# Patient Record
Sex: Male | Born: 1980 | Race: White | Hispanic: No | Marital: Married | Smoking: Former smoker
Health system: Southern US, Community
[De-identification: ages and names within clinical notes are randomized; demographics above are authoritative.]

## PROBLEM LIST (undated history)

## (undated) ENCOUNTER — Emergency Department (HOSPITAL_COMMUNITY): Payer: BC Managed Care – PPO

## (undated) DIAGNOSIS — F329 Major depressive disorder, single episode, unspecified: Secondary | ICD-10-CM

## (undated) DIAGNOSIS — F32A Depression, unspecified: Secondary | ICD-10-CM

## (undated) DIAGNOSIS — S52509A Unspecified fracture of the lower end of unspecified radius, initial encounter for closed fracture: Secondary | ICD-10-CM

## (undated) DIAGNOSIS — F419 Anxiety disorder, unspecified: Secondary | ICD-10-CM

## (undated) DIAGNOSIS — S82262A Displaced segmental fracture of shaft of left tibia, initial encounter for closed fracture: Secondary | ICD-10-CM

---

## 2012-04-05 ENCOUNTER — Emergency Department (HOSPITAL_BASED_OUTPATIENT_CLINIC_OR_DEPARTMENT_OTHER): Payer: BC Managed Care – PPO

## 2012-04-05 ENCOUNTER — Emergency Department (HOSPITAL_BASED_OUTPATIENT_CLINIC_OR_DEPARTMENT_OTHER)
Admission: EM | Admit: 2012-04-05 | Discharge: 2012-04-06 | Disposition: A | Discharge status: Home / Self Care | Payer: BC Managed Care – PPO | Attending: Emergency Medicine | Admitting: Emergency Medicine

## 2012-04-05 ENCOUNTER — Encounter (HOSPITAL_BASED_OUTPATIENT_CLINIC_OR_DEPARTMENT_OTHER): Payer: Self-pay | Admitting: *Deleted

## 2012-04-05 DIAGNOSIS — Z87891 Personal history of nicotine dependence: Secondary | ICD-10-CM | POA: Insufficient documentation

## 2012-04-05 DIAGNOSIS — J189 Pneumonia, unspecified organism: Secondary | ICD-10-CM | POA: Insufficient documentation

## 2012-04-05 DIAGNOSIS — IMO0001 Reserved for inherently not codable concepts without codable children: Secondary | ICD-10-CM | POA: Insufficient documentation

## 2012-04-05 DIAGNOSIS — R059 Cough, unspecified: Secondary | ICD-10-CM | POA: Insufficient documentation

## 2012-04-05 DIAGNOSIS — R05 Cough: Secondary | ICD-10-CM | POA: Insufficient documentation

## 2012-04-05 LAB — RAPID STREP SCREEN (MED CTR MEBANE ONLY): Streptococcus, Group A Screen (Direct): NEGATIVE

## 2012-04-05 MED ORDER — ALBUTEROL SULFATE HFA 108 (90 BASE) MCG/ACT IN AERS
2.0000 | INHALATION_SPRAY | RESPIRATORY_TRACT | Status: DC | PRN
  Administered 2012-04-06: 2 via RESPIRATORY_TRACT
  Filled 2012-04-05: qty 6.7

## 2012-04-05 MED ORDER — ACETAMINOPHEN 325 MG PO TABS
650.0000 mg | ORAL_TABLET | Freq: Once | ORAL | Status: AC
  Administered 2012-04-05: 650 mg via ORAL
  Filled 2012-04-05 (×2): qty 2

## 2012-04-05 NOTE — ED Provider Notes (Signed)
History   This chart was scribed for Peter Seamen, MD by Sofie Rower. The patient was seen in room MH11/MH11 and the patient's care was started at 11:18PM.       CSN: [id number]  Arrival date & time 04/05/12  2211   First MD Initiated Contact with Patient 04/05/12 2318      Chief Complaint  Patient presents with  . Fever    (Consider location/radiation/quality/duration/timing/severity/associated sxs/prior treatment) Patient is a 31 y.o. male presenting with fever. The history is provided by the patient. No language interpreter was used.  Fever Primary symptoms of the febrile illness include fever, cough and myalgias. Primary symptoms do not include abdominal pain, nausea, vomiting, diarrhea or rash. The current episode started yesterday. This is a new problem. The problem has been gradually worsening.  The fever began yesterday. The fever has been gradually worsening since its onset. The maximum temperature recorded prior to his arrival was 103 to 104 F. The temperature was taken by an oral thermometer.  The cough began yesterday. The cough is new. The cough is productive. The sputum is brown.  Myalgias began yesterday. The myalgias have been gradually worsening since their onset. The myalgias are generalized. The myalgias are aching. The discomfort from the myalgias is moderate. The myalgias are associated with weakness.    Peter Morrow is a 31 y.o. male who presents to the Emergency Department complaining of sudden, progressively worsening, fever (103.7, taken at home, 102.7, taken at [facility]), onset yesterday (04/04/12).  Associated symptoms include productive brown cough and myalgias. The pt reports he has been feeling weak and experiencing aching myalgias all over his body since yesterday evening (04/04/12). The pt has been taking ibuprofen and tylenol which provide moderate relief of the fever.   The pt denies sore throat, nausea, vomiting, diarrhea, chills, and abdominal pain  associated with the fever.    The pt does not smoke, however, he does drink alcohol.     History reviewed. No pertinent past medical history.  History reviewed. No pertinent past surgical history.  No family history on file.  History  Substance Use Topics  . Smoking status: Former Games developer  . Smokeless tobacco: Former Neurosurgeon  . Alcohol Use: 3.0 oz/week    5 Cans of beer per week      Review of Systems  Constitutional: Positive for fever.  Respiratory: Positive for cough.   Gastrointestinal: Negative for nausea, vomiting, abdominal pain and diarrhea.  Musculoskeletal: Positive for myalgias.  Skin: Negative for rash.  Neurological: Positive for weakness.  All other systems reviewed and are negative.    Allergies  Review of patient's allergies indicates no known allergies.  Home Medications   Current Outpatient Rx  Name Route Sig Dispense Refill  . ACETAMINOPHEN 500 MG PO TABS Oral Take 1,000 mg by mouth every 6 (six) hours as needed.    . IBUPROFEN 400 MG PO TABS Oral Take 400 mg by mouth every 6 (six) hours as needed.      BP 120/81  Pulse 135  Temp 102.7 F (39.3 C) (Oral)  Resp 22  Ht 5\' 11"  (1.803 m)  Wt 190 lb (86.183 kg)  BMI 26.50 kg/m2  SpO2 95%  Physical Exam  Nursing note and vitals reviewed. Constitutional: He is oriented to person, place, and time. He appears well-developed and well-nourished.  HENT:  Head: Atraumatic.  Nose: Nose normal.  Mouth/Throat: Oropharynx is clear and moist. No oropharyngeal exudate.       No oropharyngeal  erythema or exudate detected.   Eyes: Conjunctivae normal and EOM are normal. Pupils are equal, round, and reactive to light.  Neck: Normal range of motion.  Cardiovascular: Normal rate, regular rhythm and normal heart sounds.   Pulmonary/Chest: Effort normal and breath sounds normal.       Dry cough noted.   Abdominal: Soft. Bowel sounds are normal.  Musculoskeletal: Normal range of motion.  Neurological: He is  alert and oriented to person, place, and time.  Skin: Skin is warm and dry.  Psychiatric: He has a normal mood and affect. His behavior is normal.    ED Course  Procedures (including critical care time)  DIAGNOSTIC STUDIES: Oxygen Saturation is 95% on room air, normal by my interpretation.    COORDINATION OF CARE:    11:23 PM- Treatment plan concerning application of inhaler and evaluation for strep throat discussed with patient. Pt agrees with treatment.     MDM   Nursing notes and vitals signs, including pulse oximetry, reviewed.  Summary of this visit's results, reviewed by myself:  Labs:  Results for orders placed during the hospital encounter of 04/05/12  RAPID STREP SCREEN      Component Value Range   Streptococcus, Group A Screen (Direct) NEGATIVE  NEGATIVE    Imaging Studies: Dg Chest 2 View  04/06/2012  *RADIOLOGY REPORT*  Clinical Data: Cough and fever.  CHEST - 2 VIEW  Comparison: None.  Findings: There is focal airspace disease in the lingula consistent with pneumonia.  Right lung is clear.  Heart size is normal.  No pneumothorax or pleural fluid.  IMPRESSION: Findings consistent with pneumonia in the lingula.   Original Report Authenticated By: Bernadene Bell. Maricela Curet, M.D.           I personally performed the services described in this documentation, which was scribed in my presence.  The recorded information has been reviewed and considered.    Peter Seamen, MD 04/06/12 [phone]

## 2012-04-05 NOTE — ED Notes (Signed)
Patient states that he was working yesterday and began feeling very bad.  States that he had a fever of 103 deg.  C/O fever, chills, anorexia, headache, generalized body aches, weakness, fatigue and an occasional productive cough.  Patient states that he received his Flu shot 2 weeks ago.

## 2012-04-05 NOTE — ED Notes (Signed)
Fever, cough and body aches x 1 day- head pressure also

## 2012-04-06 MED ORDER — DOXYCYCLINE HYCLATE 100 MG PO CAPS: 100.0000 mg | ORAL_CAPSULE | Freq: Two times a day (BID) | ORAL | Status: DC

## 2012-04-06 MED ORDER — DOXYCYCLINE HYCLATE 100 MG PO TABS
100.0000 mg | ORAL_TABLET | Freq: Once | ORAL | Status: AC
  Administered 2012-04-06: 100 mg via ORAL
  Filled 2012-04-06: qty 1

## 2012-04-06 NOTE — Patient Instructions (Signed)
Instructed pt on the proper use of administering albuteral mdi via aerochamber pt tolerated well 

## 2012-04-06 NOTE — ED Notes (Signed)
rx x 1 given for doxycycline 

## 2015-06-11 DIAGNOSIS — S52509A Unspecified fracture of the lower end of unspecified radius, initial encounter for closed fracture: Secondary | ICD-10-CM

## 2015-06-11 HISTORY — DX: Unspecified fracture of the lower end of unspecified radius, initial encounter for closed fracture: S52.509A

## 2016-09-17 ENCOUNTER — Observation Stay (HOSPITAL_COMMUNITY): Payer: Worker's Compensation | Admitting: Certified Registered Nurse Anesthetist

## 2016-09-17 ENCOUNTER — Inpatient Hospital Stay (HOSPITAL_COMMUNITY)
Admission: EM | Admit: 2016-09-17 | Discharge: 2016-09-24 | DRG: 493 | Disposition: A | Discharge status: Rehabilitation Facility | Payer: Worker's Compensation | Attending: Orthopedic Surgery | Admitting: Orthopedic Surgery

## 2016-09-17 ENCOUNTER — Encounter (HOSPITAL_COMMUNITY): Payer: Self-pay | Admitting: Certified Registered Nurse Anesthetist

## 2016-09-17 ENCOUNTER — Emergency Department (HOSPITAL_COMMUNITY): Payer: Worker's Compensation

## 2016-09-17 ENCOUNTER — Observation Stay (HOSPITAL_COMMUNITY): Payer: Worker's Compensation

## 2016-09-17 ENCOUNTER — Encounter (HOSPITAL_COMMUNITY): Admission: EM | Disposition: A | Discharge status: Rehabilitation Facility | Payer: Self-pay | Source: Home / Self Care

## 2016-09-17 DIAGNOSIS — Z79899 Other long term (current) drug therapy: Secondary | ICD-10-CM

## 2016-09-17 DIAGNOSIS — S82262A Displaced segmental fracture of shaft of left tibia, initial encounter for closed fracture: Secondary | ICD-10-CM | POA: Diagnosis not present

## 2016-09-17 DIAGNOSIS — R110 Nausea: Secondary | ICD-10-CM | POA: Diagnosis present

## 2016-09-17 DIAGNOSIS — R739 Hyperglycemia, unspecified: Secondary | ICD-10-CM | POA: Diagnosis not present

## 2016-09-17 DIAGNOSIS — Y9389 Activity, other specified: Secondary | ICD-10-CM

## 2016-09-17 DIAGNOSIS — S32810A Multiple fractures of pelvis with stable disruption of pelvic ring, initial encounter for closed fracture: Secondary | ICD-10-CM

## 2016-09-17 DIAGNOSIS — S82202A Unspecified fracture of shaft of left tibia, initial encounter for closed fracture: Secondary | ICD-10-CM

## 2016-09-17 DIAGNOSIS — F329 Major depressive disorder, single episode, unspecified: Secondary | ICD-10-CM

## 2016-09-17 DIAGNOSIS — Y990 Civilian activity done for income or pay: Secondary | ICD-10-CM

## 2016-09-17 DIAGNOSIS — S7002XA Contusion of left hip, initial encounter: Secondary | ICD-10-CM | POA: Diagnosis present

## 2016-09-17 DIAGNOSIS — S32009A Unspecified fracture of unspecified lumbar vertebra, initial encounter for closed fracture: Secondary | ICD-10-CM

## 2016-09-17 DIAGNOSIS — Z419 Encounter for procedure for purposes other than remedying health state, unspecified: Secondary | ICD-10-CM

## 2016-09-17 DIAGNOSIS — T148XXA Other injury of unspecified body region, initial encounter: Secondary | ICD-10-CM

## 2016-09-17 DIAGNOSIS — D696 Thrombocytopenia, unspecified: Secondary | ICD-10-CM | POA: Diagnosis present

## 2016-09-17 DIAGNOSIS — E559 Vitamin D deficiency, unspecified: Secondary | ICD-10-CM | POA: Diagnosis present

## 2016-09-17 DIAGNOSIS — S2020XA Contusion of thorax, unspecified, initial encounter: Secondary | ICD-10-CM

## 2016-09-17 DIAGNOSIS — T1490XA Injury, unspecified, initial encounter: Secondary | ICD-10-CM

## 2016-09-17 DIAGNOSIS — S82402A Unspecified fracture of shaft of left fibula, initial encounter for closed fracture: Secondary | ICD-10-CM | POA: Diagnosis present

## 2016-09-17 DIAGNOSIS — E8889 Other specified metabolic disorders: Secondary | ICD-10-CM | POA: Diagnosis present

## 2016-09-17 DIAGNOSIS — D62 Acute posthemorrhagic anemia: Secondary | ICD-10-CM

## 2016-09-17 DIAGNOSIS — S3210XA Unspecified fracture of sacrum, initial encounter for closed fracture: Secondary | ICD-10-CM | POA: Diagnosis present

## 2016-09-17 DIAGNOSIS — Z8042 Family history of malignant neoplasm of prostate: Secondary | ICD-10-CM

## 2016-09-17 DIAGNOSIS — S32059A Unspecified fracture of fifth lumbar vertebra, initial encounter for closed fracture: Secondary | ICD-10-CM | POA: Diagnosis present

## 2016-09-17 DIAGNOSIS — R0682 Tachypnea, not elsewhere classified: Secondary | ICD-10-CM

## 2016-09-17 DIAGNOSIS — F32A Depression, unspecified: Secondary | ICD-10-CM

## 2016-09-17 DIAGNOSIS — S301XXA Contusion of abdominal wall, initial encounter: Secondary | ICD-10-CM | POA: Diagnosis present

## 2016-09-17 DIAGNOSIS — D72829 Elevated white blood cell count, unspecified: Secondary | ICD-10-CM

## 2016-09-17 DIAGNOSIS — S300XXA Contusion of lower back and pelvis, initial encounter: Secondary | ICD-10-CM

## 2016-09-17 DIAGNOSIS — Y929 Unspecified place or not applicable: Secondary | ICD-10-CM

## 2016-09-17 DIAGNOSIS — S82102A Unspecified fracture of upper end of left tibia, initial encounter for closed fracture: Secondary | ICD-10-CM | POA: Diagnosis present

## 2016-09-17 DIAGNOSIS — F410 Panic disorder [episodic paroxysmal anxiety] without agoraphobia: Secondary | ICD-10-CM | POA: Diagnosis present

## 2016-09-17 DIAGNOSIS — G4700 Insomnia, unspecified: Secondary | ICD-10-CM | POA: Diagnosis not present

## 2016-09-17 DIAGNOSIS — S7012XA Contusion of left thigh, initial encounter: Secondary | ICD-10-CM | POA: Diagnosis present

## 2016-09-17 DIAGNOSIS — W208XXA Other cause of strike by thrown, projected or falling object, initial encounter: Secondary | ICD-10-CM | POA: Diagnosis present

## 2016-09-17 DIAGNOSIS — Z87891 Personal history of nicotine dependence: Secondary | ICD-10-CM

## 2016-09-17 DIAGNOSIS — R000 Tachycardia, unspecified: Secondary | ICD-10-CM

## 2016-09-17 DIAGNOSIS — R21 Rash and other nonspecific skin eruption: Secondary | ICD-10-CM | POA: Diagnosis not present

## 2016-09-17 DIAGNOSIS — K5900 Constipation, unspecified: Secondary | ICD-10-CM | POA: Diagnosis present

## 2016-09-17 DIAGNOSIS — N179 Acute kidney failure, unspecified: Secondary | ICD-10-CM | POA: Diagnosis present

## 2016-09-17 DIAGNOSIS — S3993XA Unspecified injury of pelvis, initial encounter: Secondary | ICD-10-CM | POA: Diagnosis present

## 2016-09-17 DIAGNOSIS — G8918 Other acute postprocedural pain: Secondary | ICD-10-CM

## 2016-09-17 HISTORY — PX: SACRO-ILIAC PINNING: SHX5050

## 2016-09-17 HISTORY — DX: Anxiety disorder, unspecified: F41.9

## 2016-09-17 HISTORY — DX: Unspecified fracture of the lower end of unspecified radius, initial encounter for closed fracture: S52.509A

## 2016-09-17 HISTORY — DX: Displaced segmental fracture of shaft of left tibia, initial encounter for closed fracture: S82.262A

## 2016-09-17 HISTORY — DX: Depression, unspecified: F32.A

## 2016-09-17 HISTORY — DX: Major depressive disorder, single episode, unspecified: F32.9

## 2016-09-17 HISTORY — PX: TIBIA IM NAIL INSERTION: SHX2516

## 2016-09-17 LAB — PREPARE FRESH FROZEN PLASMA
UNIT DIVISION: 0
Unit division: 0

## 2016-09-17 LAB — I-STAT CHEM 8, ED
BUN: 18 mg/dL (ref 6–20)
CREATININE: 1.3 mg/dL — AB (ref 0.61–1.24)
Calcium, Ion: 1.08 mmol/L — ABNORMAL LOW (ref 1.15–1.40)
Chloride: 103 mmol/L (ref 101–111)
Glucose, Bld: 161 mg/dL — ABNORMAL HIGH (ref 65–99)
HEMATOCRIT: 44 % (ref 39.0–52.0)
Hemoglobin: 15 g/dL (ref 13.0–17.0)
Potassium: 3.5 mmol/L (ref 3.5–5.1)
Sodium: 139 mmol/L (ref 135–145)
TCO2: 25 mmol/L (ref 0–100)

## 2016-09-17 LAB — CBC WITH DIFFERENTIAL/PLATELET
BASOS ABS: 0 10*3/uL (ref 0.0–0.1)
Basophils Relative: 0 %
EOS PCT: 0 %
Eosinophils Absolute: 0 10*3/uL (ref 0.0–0.7)
HEMATOCRIT: 44 % (ref 39.0–52.0)
Hemoglobin: 14.7 g/dL (ref 13.0–17.0)
LYMPHS ABS: 2.5 10*3/uL (ref 0.7–4.0)
Lymphocytes Relative: 10 %
MCH: 28.3 pg (ref 26.0–34.0)
MCHC: 33.4 g/dL (ref 30.0–36.0)
MCV: 84.6 fL (ref 78.0–100.0)
MONOS PCT: 6 %
Monocytes Absolute: 1.5 10*3/uL — ABNORMAL HIGH (ref 0.1–1.0)
Neutro Abs: 21 10*3/uL — ABNORMAL HIGH (ref 1.7–7.7)
Neutrophils Relative %: 84 %
Platelets: 232 10*3/uL (ref 150–400)
RBC: 5.2 MIL/uL (ref 4.22–5.81)
RDW: 13.3 % (ref 11.5–15.5)
WBC: 25 10*3/uL — AB (ref 4.0–10.5)

## 2016-09-17 LAB — BPAM FFP
Blood Product Expiration Date: 201804112359
Blood Product Expiration Date: 201804112359
ISSUE DATE / TIME: 201804101756
ISSUE DATE / TIME: 201804101756
UNIT TYPE AND RH: 6200
Unit Type and Rh: 6200

## 2016-09-17 LAB — ABO/RH: ABO/RH(D): A POS

## 2016-09-17 LAB — HEMOGLOBIN AND HEMATOCRIT, BLOOD
HCT: 40 % (ref 39.0–52.0)
Hemoglobin: 13.2 g/dL (ref 13.0–17.0)

## 2016-09-17 LAB — BASIC METABOLIC PANEL
Anion gap: 11 (ref 5–15)
BUN: 15 mg/dL (ref 6–20)
CHLORIDE: 104 mmol/L (ref 101–111)
CO2: 23 mmol/L (ref 22–32)
Calcium: 8.8 mg/dL — ABNORMAL LOW (ref 8.9–10.3)
Creatinine, Ser: 1.29 mg/dL — ABNORMAL HIGH (ref 0.61–1.24)
GFR calc non Af Amer: >60 mL/min (ref >60)
Glucose, Bld: 162 mg/dL — ABNORMAL HIGH (ref 65–99)
Potassium: 3.6 mmol/L (ref 3.5–5.1)
SODIUM: 138 mmol/L (ref 135–145)

## 2016-09-17 LAB — CBC
HCT: 36.4 % — ABNORMAL LOW (ref 39.0–52.0)
HEMOGLOBIN: 11.9 g/dL — AB (ref 13.0–17.0)
MCH: 28 pg (ref 26.0–34.0)
MCHC: 32.7 g/dL (ref 30.0–36.0)
MCV: 85.6 fL (ref 78.0–100.0)
Platelets: 157 10*3/uL (ref 150–400)
RBC: 4.25 MIL/uL (ref 4.22–5.81)
RDW: 13.6 % (ref 11.5–15.5)
WBC: 15.6 10*3/uL — ABNORMAL HIGH (ref 4.0–10.5)

## 2016-09-17 LAB — SAMPLE TO BLOOD BANK

## 2016-09-17 LAB — PREPARE RBC (CROSSMATCH)

## 2016-09-17 SURGERY — INSERTION, INTRAMEDULLARY ROD, TIBIA
Anesthesia: General | Site: Leg Lower | Laterality: Left

## 2016-09-17 MED ORDER — CEFAZOLIN IN D5W 1 GM/50ML IV SOLN
1.0000 g | Freq: Four times a day (QID) | INTRAVENOUS | Status: AC
  Administered 2016-09-17 – 2016-09-18 (×3): 1 g via INTRAVENOUS
  Filled 2016-09-17 (×4): qty 50

## 2016-09-17 MED ORDER — DEXAMETHASONE SODIUM PHOSPHATE 10 MG/ML IJ SOLN
INTRAMUSCULAR | Status: DC | PRN
  Administered 2016-09-17: 4 mg via INTRAVENOUS

## 2016-09-17 MED ORDER — LIDOCAINE HCL (CARDIAC) 20 MG/ML IV SOLN
INTRAVENOUS | Status: DC | PRN
  Administered 2016-09-17: 60 mg via INTRAVENOUS

## 2016-09-17 MED ORDER — OXYCODONE HCL 5 MG PO TABS: 5.0000 mg | ORAL_TABLET | Freq: Once | ORAL | Status: DC | PRN

## 2016-09-17 MED ORDER — ROCURONIUM BROMIDE 100 MG/10ML IV SOLN
INTRAVENOUS | Status: DC | PRN
  Administered 2016-09-17: 20 mg via INTRAVENOUS
  Administered 2016-09-17: 50 mg via INTRAVENOUS
  Administered 2016-09-17: 30 mg via INTRAVENOUS

## 2016-09-17 MED ORDER — SODIUM CHLORIDE 0.9 % IV SOLN
INTRAVENOUS | Status: DC | PRN
  Administered 2016-09-17: 19:00:00 via INTRAVENOUS

## 2016-09-17 MED ORDER — HYDROMORPHONE HCL 1 MG/ML IJ SOLN
INTRAMUSCULAR | Status: AC
  Filled 2016-09-17: qty 1

## 2016-09-17 MED ORDER — OXYCODONE HCL 5 MG/5ML PO SOLN: 5.0000 mg | Freq: Once | ORAL | Status: DC | PRN

## 2016-09-17 MED ORDER — HYDROMORPHONE HCL 1 MG/ML IJ SOLN
INTRAMUSCULAR | Status: AC
  Administered 2016-09-17: 0.5 mg via INTRAVENOUS
  Filled 2016-09-17: qty 1

## 2016-09-17 MED ORDER — FENTANYL CITRATE (PF) 250 MCG/5ML IJ SOLN
INTRAMUSCULAR | Status: AC
  Filled 2016-09-17: qty 5

## 2016-09-17 MED ORDER — HYDROMORPHONE HCL 1 MG/ML IJ SOLN
0.5000 mg | INTRAMUSCULAR | Status: DC | PRN
  Administered 2016-09-17 – 2016-09-18 (×2): 1 mg via INTRAVENOUS
  Filled 2016-09-17 (×2): qty 1

## 2016-09-17 MED ORDER — METHOCARBAMOL 500 MG PO TABS
500.0000 mg | ORAL_TABLET | Freq: Four times a day (QID) | ORAL | Status: DC | PRN
  Administered 2016-09-18 – 2016-09-23 (×7): 1000 mg via ORAL
  Filled 2016-09-17 (×8): qty 2

## 2016-09-17 MED ORDER — LACTATED RINGERS IV SOLN
INTRAVENOUS | Status: DC | PRN
  Administered 2016-09-17 (×3): via INTRAVENOUS

## 2016-09-17 MED ORDER — IOPAMIDOL (ISOVUE-300) INJECTION 61%
INTRAVENOUS | Status: AC
  Filled 2016-09-17: qty 100

## 2016-09-17 MED ORDER — SUGAMMADEX SODIUM 200 MG/2ML IV SOLN
INTRAVENOUS | Status: DC | PRN
  Administered 2016-09-17: 200 mg via INTRAVENOUS

## 2016-09-17 MED ORDER — SODIUM CHLORIDE 0.9 % IV BOLUS (SEPSIS)
1000.0000 mL | Freq: Once | INTRAVENOUS | Status: AC
  Administered 2016-09-17: 1000 mL via INTRAVENOUS

## 2016-09-17 MED ORDER — SODIUM CHLORIDE 0.9 % IV SOLN: 10.0000 mL/h | Freq: Once | INTRAVENOUS | Status: DC

## 2016-09-17 MED ORDER — METOCLOPRAMIDE HCL 5 MG PO TABS: 5.0000 mg | ORAL_TABLET | Freq: Three times a day (TID) | ORAL | Status: DC | PRN

## 2016-09-17 MED ORDER — ONDANSETRON HCL 4 MG/2ML IJ SOLN
4.0000 mg | Freq: Four times a day (QID) | INTRAMUSCULAR | Status: DC | PRN
  Administered 2016-09-19 – 2016-09-23 (×3): 4 mg via INTRAVENOUS
  Filled 2016-09-17 (×3): qty 2

## 2016-09-17 MED ORDER — METHOCARBAMOL 1000 MG/10ML IJ SOLN
500.0000 mg | Freq: Four times a day (QID) | INTRAVENOUS | Status: DC | PRN
  Filled 2016-09-17: qty 5

## 2016-09-17 MED ORDER — ONDANSETRON HCL 4 MG/2ML IJ SOLN
4.0000 mg | Freq: Once | INTRAMUSCULAR | Status: AC
  Administered 2016-09-17: 4 mg via INTRAVENOUS
  Filled 2016-09-17: qty 2

## 2016-09-17 MED ORDER — SUCCINYLCHOLINE CHLORIDE 20 MG/ML IJ SOLN
INTRAMUSCULAR | Status: DC | PRN
  Administered 2016-09-17: 70 mg via INTRAVENOUS

## 2016-09-17 MED ORDER — PHENYLEPHRINE 40 MCG/ML (10ML) SYRINGE FOR IV PUSH (FOR BLOOD PRESSURE SUPPORT)
PREFILLED_SYRINGE | INTRAVENOUS | Status: AC
  Filled 2016-09-17: qty 10

## 2016-09-17 MED ORDER — SODIUM CHLORIDE 0.9 % IV SOLN
10.0000 mL/h | Freq: Once | INTRAVENOUS | Status: AC
  Administered 2016-09-17: 10 mL/h via INTRAVENOUS

## 2016-09-17 MED ORDER — HYDROCODONE-ACETAMINOPHEN 10-325 MG PO TABS
1.0000 | ORAL_TABLET | Freq: Four times a day (QID) | ORAL | Status: DC | PRN
  Administered 2016-09-18 – 2016-09-19 (×2): 2 via ORAL
  Administered 2016-09-19: 1 via ORAL
  Administered 2016-09-19: 2 via ORAL
  Filled 2016-09-17 (×3): qty 2
  Filled 2016-09-17: qty 1
  Filled 2016-09-17: qty 2

## 2016-09-17 MED ORDER — DEXAMETHASONE SODIUM PHOSPHATE 10 MG/ML IJ SOLN
INTRAMUSCULAR | Status: AC
  Filled 2016-09-17: qty 1

## 2016-09-17 MED ORDER — MIDAZOLAM HCL 2 MG/2ML IJ SOLN
INTRAMUSCULAR | Status: AC
  Filled 2016-09-17: qty 2

## 2016-09-17 MED ORDER — ACETAMINOPHEN 650 MG RE SUPP: 650.0000 mg | Freq: Four times a day (QID) | RECTAL | Status: DC | PRN

## 2016-09-17 MED ORDER — PHENYLEPHRINE HCL 10 MG/ML IJ SOLN
INTRAMUSCULAR | Status: DC | PRN
  Administered 2016-09-17: 100 ug/min via INTRAVENOUS

## 2016-09-17 MED ORDER — ONDANSETRON HCL 4 MG/2ML IJ SOLN
INTRAMUSCULAR | Status: AC
  Filled 2016-09-17: qty 2

## 2016-09-17 MED ORDER — ACETAMINOPHEN 325 MG PO TABS: 650.0000 mg | ORAL_TABLET | ORAL | Status: DC | PRN

## 2016-09-17 MED ORDER — ONDANSETRON HCL 4 MG PO TABS
4.0000 mg | ORAL_TABLET | Freq: Four times a day (QID) | ORAL | Status: DC | PRN
  Filled 2016-09-17: qty 1

## 2016-09-17 MED ORDER — CEFAZOLIN SODIUM-DEXTROSE 2-3 GM-% IV SOLR
INTRAVENOUS | Status: DC | PRN
  Administered 2016-09-17: 2 g via INTRAVENOUS

## 2016-09-17 MED ORDER — HYDROMORPHONE HCL 1 MG/ML IJ SOLN
1.0000 mg | Freq: Once | INTRAMUSCULAR | Status: AC
  Administered 2016-09-17: 1 mg via INTRAVENOUS
  Filled 2016-09-17: qty 1

## 2016-09-17 MED ORDER — ACETAMINOPHEN 325 MG PO TABS: 650.0000 mg | ORAL_TABLET | Freq: Four times a day (QID) | ORAL | Status: DC | PRN

## 2016-09-17 MED ORDER — HYDROMORPHONE HCL 1 MG/ML IJ SOLN
0.2500 mg | INTRAMUSCULAR | Status: DC | PRN
  Administered 2016-09-17 (×2): 0.5 mg via INTRAVENOUS

## 2016-09-17 MED ORDER — DOCUSATE SODIUM 100 MG PO CAPS
100.0000 mg | ORAL_CAPSULE | Freq: Two times a day (BID) | ORAL | Status: DC
  Administered 2016-09-18 – 2016-09-24 (×12): 100 mg via ORAL
  Filled 2016-09-17 (×13): qty 1

## 2016-09-17 MED ORDER — LORAZEPAM 2 MG/ML IJ SOLN
0.5000 mg | Freq: Three times a day (TID) | INTRAMUSCULAR | Status: DC | PRN
  Administered 2016-09-20 – 2016-09-23 (×4): 0.5 mg via INTRAVENOUS
  Filled 2016-09-17 (×4): qty 1

## 2016-09-17 MED ORDER — LACTATED RINGERS IV SOLN
INTRAVENOUS | Status: DC | PRN
  Administered 2016-09-17 (×2): via INTRAVENOUS

## 2016-09-17 MED ORDER — PROPOFOL 10 MG/ML IV BOLUS
INTRAVENOUS | Status: AC
  Filled 2016-09-17: qty 20

## 2016-09-17 MED ORDER — FENTANYL CITRATE (PF) 100 MCG/2ML IJ SOLN
INTRAMUSCULAR | Status: DC | PRN
  Administered 2016-09-17: 50 ug via INTRAVENOUS
  Administered 2016-09-17: 150 ug via INTRAVENOUS
  Administered 2016-09-17: 50 ug via INTRAVENOUS

## 2016-09-17 MED ORDER — 0.9 % SODIUM CHLORIDE (POUR BTL) OPTIME
TOPICAL | Status: DC | PRN
  Administered 2016-09-17: 1000 mL

## 2016-09-17 MED ORDER — PROPOFOL 10 MG/ML IV BOLUS
INTRAVENOUS | Status: DC | PRN
Start: 2016-09-17 — End: 2016-09-17
  Administered 2016-09-17: 50 mg via INTRAVENOUS
  Administered 2016-09-17: 30 mg via INTRAVENOUS
  Administered 2016-09-17: 100 mg via INTRAVENOUS

## 2016-09-17 MED ORDER — METOCLOPRAMIDE HCL 5 MG/ML IJ SOLN: 5.0000 mg | Freq: Three times a day (TID) | INTRAMUSCULAR | Status: DC | PRN

## 2016-09-17 MED ORDER — SODIUM CHLORIDE 0.45 % IV SOLN
INTRAVENOUS | Status: DC
  Administered 2016-09-17 – 2016-09-19 (×4): via INTRAVENOUS

## 2016-09-17 MED ORDER — MIDAZOLAM HCL 5 MG/5ML IJ SOLN
INTRAMUSCULAR | Status: DC | PRN
  Administered 2016-09-17: 2 mg via INTRAVENOUS

## 2016-09-17 MED ORDER — ONDANSETRON HCL 4 MG/2ML IJ SOLN
INTRAMUSCULAR | Status: DC | PRN
  Administered 2016-09-17: 4 mg via INTRAVENOUS

## 2016-09-17 SURGICAL SUPPLY — 76 items
BANDAGE ACE 4X5 VEL STRL LF (GAUZE/BANDAGES/DRESSINGS) ×4 IMPLANT
BANDAGE ACE 6X5 VEL STRL LF (GAUZE/BANDAGES/DRESSINGS) ×4 IMPLANT
BANDAGE ESMARK 6X9 LF (GAUZE/BANDAGES/DRESSINGS) IMPLANT
BIT DRILL 3.8X6 NS (BIT) ×4 IMPLANT
BIT DRILL 4.4 NS (BIT) ×4 IMPLANT
BLADE CLIPPER SURG (BLADE) ×4 IMPLANT
BLADE SURG 10 STRL SS (BLADE) IMPLANT
BNDG COHESIVE 4X5 TAN STRL (GAUZE/BANDAGES/DRESSINGS) ×4 IMPLANT
BNDG ESMARK 6X9 LF (GAUZE/BANDAGES/DRESSINGS)
BNDG GAUZE ELAST 4 BULKY (GAUZE/BANDAGES/DRESSINGS) ×4 IMPLANT
BRUSH SCRUB DISP (MISCELLANEOUS) ×8 IMPLANT
CATH THORACIC 20FR (CATHETERS) ×4 IMPLANT
CONNECTOR 5 IN 1 STRAIGHT STRL (MISCELLANEOUS) ×4 IMPLANT
COVER SURGICAL LIGHT HANDLE (MISCELLANEOUS) ×8 IMPLANT
DRAPE C-ARM 42X72 X-RAY (DRAPES) ×4 IMPLANT
DRAPE C-ARMOR (DRAPES) ×8 IMPLANT
DRAPE IMP U-DRAPE 54X76 (DRAPES) ×4 IMPLANT
DRAPE INCISE IOBAN 66X45 STRL (DRAPES) ×4 IMPLANT
DRAPE LAPAROTOMY TRNSV 102X78 (DRAPE) ×4 IMPLANT
DRAPE ORTHO SPLIT 77X108 STRL (DRAPES) ×4
DRAPE SURG ORHT 6 SPLT 77X108 (DRAPES) ×4 IMPLANT
DRAPE U-SHAPE 47X51 STRL (DRAPES) ×8 IMPLANT
DRSG ADAPTIC 3X8 NADH LF (GAUZE/BANDAGES/DRESSINGS) IMPLANT
DRSG MEPILEX BORDER 4X4 (GAUZE/BANDAGES/DRESSINGS) ×4 IMPLANT
DRSG MEPITEL 4X7.2 (GAUZE/BANDAGES/DRESSINGS) ×4 IMPLANT
DRSG PAD ABDOMINAL 8X10 ST (GAUZE/BANDAGES/DRESSINGS) ×12 IMPLANT
ELECT CAUTERY BLADE 6.4 (BLADE) ×4 IMPLANT
ELECT REM PT RETURN 9FT ADLT (ELECTROSURGICAL) ×4
ELECTRODE REM PT RTRN 9FT ADLT (ELECTROSURGICAL) ×2 IMPLANT
EVACUATOR 1/8 PVC DRAIN (DRAIN) IMPLANT
GAUZE SPONGE 4X4 12PLY STRL (GAUZE/BANDAGES/DRESSINGS) ×16 IMPLANT
GLOVE BIO SURGEON STRL SZ 6.5 (GLOVE) ×12 IMPLANT
GLOVE BIO SURGEON STRL SZ7 (GLOVE) ×4 IMPLANT
GLOVE BIO SURGEON STRL SZ8 (GLOVE) ×16 IMPLANT
GLOVE BIO SURGEONS STRL SZ 6.5 (GLOVE) ×4
GLOVE BIOGEL PI IND STRL 7.5 (GLOVE) ×4 IMPLANT
GLOVE BIOGEL PI IND STRL 8 (GLOVE) ×2 IMPLANT
GLOVE BIOGEL PI INDICATOR 7.5 (GLOVE) ×4
GLOVE BIOGEL PI INDICATOR 8 (GLOVE) ×2
GLOVE SURG SS PI 7.5 STRL IVOR (GLOVE) ×4 IMPLANT
GLOVE XGUARD RR 2 7.5 (GLOVE) ×2 IMPLANT
GLOVE XGUARD RR2 7.5 (GLOVE) ×2
GOWN STRL REUS W/ TWL LRG LVL3 (GOWN DISPOSABLE) ×8 IMPLANT
GOWN STRL REUS W/TWL LRG LVL3 (GOWN DISPOSABLE) ×8
GUIDEWIRE BALL NOSE 80CM (WIRE) ×4 IMPLANT
KIT BASIN OR (CUSTOM PROCEDURE TRAY) ×4 IMPLANT
KIT ROOM TURNOVER OR (KITS) ×4 IMPLANT
NAIL TIBIAL 9MMX34.5CM (Nail) ×4 IMPLANT
PACK GENERAL/GYN (CUSTOM PROCEDURE TRAY) ×4 IMPLANT
PACK UNIVERSAL I (CUSTOM PROCEDURE TRAY) ×4 IMPLANT
PAD ARMBOARD 7.5X6 YLW CONV (MISCELLANEOUS) ×8 IMPLANT
PAD CAST 4YDX4 CTTN HI CHSV (CAST SUPPLIES) ×2 IMPLANT
PADDING CAST COTTON 4X4 STRL (CAST SUPPLIES) ×2
SCREW ACECAP 40MM (Screw) ×4 IMPLANT
SCREW ACECAP 46MM (Screw) ×4 IMPLANT
SCREW CANN 7.3X90 32MM THRD (Screw) ×4 IMPLANT
SCREW PROXIMAL 4.5MMX18MM (Screw) ×4 IMPLANT
SCREW PROXIMAL DEPUY (Screw) ×4 IMPLANT
SCREW PRXML FT 65X5.5XNS CORT (Screw) ×4 IMPLANT
STAPLER VISISTAT 35W (STAPLE) ×4 IMPLANT
SUT ETHILON 2 0 PSLX (SUTURE) ×4 IMPLANT
SUT ETHILON 3 0 PS 1 (SUTURE) ×8 IMPLANT
SUT PROLENE 3 0 PS 2 (SUTURE) ×4 IMPLANT
SUT VIC AB 0 CT1 27 (SUTURE) ×2
SUT VIC AB 0 CT1 27XBRD ANBCTR (SUTURE) ×2 IMPLANT
SUT VIC AB 2-0 CT1 27 (SUTURE) ×4
SUT VIC AB 2-0 CT1 TAPERPNT 27 (SUTURE) ×4 IMPLANT
SUT VIC AB 2-0 CT3 27 (SUTURE) IMPLANT
SYSTEM SAHARA CHEST DRAIN ATS (WOUND CARE) ×4 IMPLANT
TOWEL OR 17X24 6PK STRL BLUE (TOWEL DISPOSABLE) ×4 IMPLANT
TOWEL OR 17X26 10 PK STRL BLUE (TOWEL DISPOSABLE) ×4 IMPLANT
TRAY FOLEY W/METER SILVER 16FR (SET/KITS/TRAYS/PACK) ×4 IMPLANT
TUBE CONNECTING 12'X1/4 (SUCTIONS) ×1
TUBE CONNECTING 12X1/4 (SUCTIONS) ×3 IMPLANT
WASHER OIC 13MM 6 PACK (Screw) ×4 IMPLANT
YANKAUER SUCT BULB TIP NO VENT (SUCTIONS) ×8 IMPLANT

## 2016-09-17 NOTE — Consult Note (Signed)
Reason for Consult:Pelvic ring fx, left tib/fib fx Referring Physician: Raliegh Ip Jawanza Morrow is an 36 y.o. male.  HPI: Peter Morrow was supervising a tree being cut down when it fell in an unexpected way, striking him on the left side. It knocked him down and then landed on his left lower leg. He was brought to the ED for evaluation. Workup showed a pelvic ring fx and a left tib/fib fx. He also had tachycardia and lumbar TVP fxs.  No past medical history on file.  No past surgical history on file.  No family history on file.  Social History:  reports that he has quit smoking. He has quit using smokeless tobacco. He reports that he drinks about 3.0 oz of alcohol per week . He reports that he does not use drugs.  Allergies: No Known Allergies  Medications: I have reviewed the patient's current medications.  Results for orders placed or performed during the hospital encounter of 09/17/16 (from the past 48 hour(s))  Sample to Blood Bank     Status: None   Collection Time: 09/17/16 12:15 PM  Result Value Ref Range   Blood Bank Specimen SAMPLE AVAILABLE FOR TESTING    Sample Expiration 09/18/2016   CBC with Differential     Status: Abnormal   Collection Time: 09/17/16 12:15 PM  Result Value Ref Range   WBC 25.0 (H) 4.0 - 10.5 K/uL   RBC 5.20 4.22 - 5.81 MIL/uL   Hemoglobin 14.7 13.0 - 17.0 g/dL   HCT 44.0 39.0 - 52.0 %   MCV 84.6 78.0 - 100.0 fL   MCH 28.3 26.0 - 34.0 pg   MCHC 33.4 30.0 - 36.0 g/dL   RDW 13.3 11.5 - 15.5 %   Platelets 232 150 - 400 K/uL   Neutrophils Relative % 84 %   Lymphocytes Relative 10 %   Monocytes Relative 6 %   Eosinophils Relative 0 %   Basophils Relative 0 %   Neutro Abs 21.0 (H) 1.7 - 7.7 K/uL   Lymphs Abs 2.5 0.7 - 4.0 K/uL   Monocytes Absolute 1.5 (H) 0.1 - 1.0 K/uL   Eosinophils Absolute 0.0 0.0 - 0.7 K/uL   Basophils Absolute 0.0 0.0 - 0.1 K/uL   WBC Morphology MILD LEFT SHIFT (1-5% METAS, OCC MYELO, OCC BANDS)    Smear Review LARGE  PLATELETS PRESENT   Basic metabolic panel     Status: Abnormal   Collection Time: 09/17/16 12:15 PM  Result Value Ref Range   Sodium 138 135 - 145 mmol/L   Potassium 3.6 3.5 - 5.1 mmol/L   Chloride 104 101 - 111 mmol/L   CO2 23 22 - 32 mmol/L   Glucose, Bld 162 (H) 65 - 99 mg/dL   BUN 15 6 - 20 mg/dL   Creatinine, Ser 1.29 (H) 0.61 - 1.24 mg/dL   Calcium 8.8 (L) 8.9 - 10.3 mg/dL   GFR calc non Af Amer >60 >60 mL/min   GFR calc Af Amer >60 >60 mL/min    Comment: (NOTE) The eGFR has been calculated using the CKD EPI equation. This calculation has not been validated in all clinical situations. eGFR's persistently <60 mL/min signify possible Chronic Kidney Disease.    Anion gap 11 5 - 15  ABO/Rh     Status: None   Collection Time: 09/17/16 12:15 PM  Result Value Ref Range   ABO/RH(D) A POS   I-stat Chem 8, ED     Status: Abnormal   Collection Time:  09/17/16 12:26 PM  Result Value Ref Range   Sodium 139 135 - 145 mmol/L   Potassium 3.5 3.5 - 5.1 mmol/L   Chloride 103 101 - 111 mmol/L   BUN 18 6 - 20 mg/dL   Creatinine, Ser 1.30 (H) 0.61 - 1.24 mg/dL   Glucose, Bld 161 (H) 65 - 99 mg/dL   Calcium, Ion 1.08 (L) 1.15 - 1.40 mmol/L   TCO2 25 0 - 100 mmol/L   Hemoglobin 15.0 13.0 - 17.0 g/dL   HCT 44.0 39.0 - 52.0 %  Prepare RBC     Status: None   Collection Time: 09/17/16  2:22 PM  Result Value Ref Range   Order Confirmation ORDER PROCESSED BY BLOOD BANK   Type and screen Peter Morrow     Status: None   Collection Time: 09/17/16  3:03 PM  Result Value Ref Range   ABO/RH(D) A POS    Antibody Screen NEG    Sample Expiration 09/20/2016     Ct Abdomen Pelvis W Contrast  Result Date: 09/17/2016 CLINICAL DATA:  Struck by a falling tree. Left-sided hip and pelvic pain. EXAM: CT ABDOMEN AND PELVIS WITH CONTRAST TECHNIQUE: Multidetector CT imaging of the abdomen and pelvis was performed using the standard protocol following bolus administration of intravenous  contrast. CONTRAST:  100 cc Isovue-300 COMPARISON:  Plain radiography same day FINDINGS: Lower chest: Mild atelectasis at both dependent lung bases. No pneumothorax or hemothorax. No pericardial fluid. Hepatobiliary: Liver parenchyma is normal. No calcified gallstones. No ductal dilatation. Pancreas: Normal Spleen: Normal Adrenals/Urinary Tract: Adrenal glands are normal. Right kidney is normal. 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 No sign of renal injury. Bladder appears intact. Pelvic hematoma external to the bladder. See below. Stomach/Bowel: Normal Vascular/Lymphatic: Aorta and IVC are normal. No retroperitoneal mass or lymphadenopathy. See below for traumatic findings in the pelvis. Reproductive: Normal Other: No free intraperitoneal fluid or air. Musculoskeletal: Minimal displaced fractures of the left second, third, fourth and fifth transverse processes. Nondisplaced right sacral fracture. Even possible this could be old. Favor at least some acute component. Mild diastases of the left sacroiliac joint. Mild diet stasis at the pubic symphysis. Adjacent active extravasation is evident. Extraperitoneal hematoma in the anterior and left pelvis with some mass effect upon the bladder. Some bleeding into the paraspinous musculature relative to the transverse process fractures. Left lower abdominal musculature hematoma and subcutaneous fat hematoma and hematoma within the soft tissues lateral to the left hip. IMPRESSION: Lumbar spine left transverse process fractures 2 through 5. Some associated muscular hemorrhage. Mild diastases of the left sacroiliac joint and symphysis pubis. Active arterial extravasation adjacent to the symphysis pubis with extraperitoneal hemorrhage with mass-effect upon the anterior bladder, extending along the left pelvic sidewall. Abnormal appearance of the right sacrum. I question whether this is an old sacral fracture. If there is not a clear history of old pelvic  injury, this must be presumed represent an acute right sacral fracture. Soft tissue hemorrhage affecting the left-sided abdominal wall musculature in superficial fat and the superficial fat lateral to the left hip. Left pelvic kidney incidentally noted. Critical Value/emergent results were called by telephone at the time of interpretation on 09/17/2016 at 2:25 pm to Dr. Lajean Saver , who verbally acknowledged these results. Electronically Signed   By: Nelson Chimes M.D.   On: 09/17/2016 14:26   Dg Pelvis Portable  Result Date: 09/17/2016 CLINICAL DATA:  Struck by a falling tree. Left-sided  pelvic and hip pain. EXAM: PORTABLE PELVIS 1-2 VIEWS COMPARISON:  None. FINDINGS: Offset of the symphysis pubis with the left side being lower than the right. No clear sacral fracture visible on this one view. Question struck disruption on the right. CT scan suggested. IMPRESSION: Offset at the symphysis pubis indicating pelvic ring disruption. Question sacral strut disruption on the right. CT recommended. Electronically Signed   By: Nelson Chimes M.D.   On: 09/17/2016 13:14   Dg Chest Port 1 View  Result Date: 09/17/2016 CLINICAL DATA:  Initial encounter for Pt hit by a falling tree today - tree hit left side, mostly lower extremity area - not having any chest pain at this time, just some lower back pain as well as lower leg - no heart related issues known EXAM: PORTABLE CHEST 1 VIEW COMPARISON:  04/05/2012 FINDINGS: Numerous leads and wires project over the chest. Midline trachea. Normal heart size and mediastinal contours. No pleural effusion or pneumothorax. Mild low low lung volumes. Clear lungs. IMPRESSION: No acute cardiopulmonary disease. Electronically Signed   By: Abigail Miyamoto M.D.   On: 09/17/2016 13:12   Dg Tibia/fibula Left Port  Result Date: 09/17/2016 CLINICAL DATA:  Struck by a falling tree. Lower extremity pain and deformity. EXAM: PORTABLE LEFT TIBIA AND FIBULA - 2 VIEW COMPARISON:  None. FINDINGS:  Complete transverse fractures of the tibia and fibula at the junction of the mid and distal thirds. Distal fragments displaced medially and posteriorly. Minor combination at these fracture sites. Nondisplaced fractures of the proximal tibia at the junction of the metaphysis and diaphysis. IMPRESSION: Complete transverse fractures of the tibia and fibula at the junction of the mid and distal thirds with displacement. Nondisplaced fractures also of the proximal tibia at the metaphyseal diaphyseal junction. Electronically Signed   By: Nelson Chimes M.D.   On: 09/17/2016 13:16   Dg Ankle Left Port  Result Date: 09/17/2016 CLINICAL DATA:  Struck by a falling tree.  Ankle pain. EXAM: PORTABLE LEFT ANKLE - 2 VIEW COMPARISON:  Tib-fib earlier same day FINDINGS: No ankle or hindfoot abnormality. See tib-fib report for description of complete transverse fractures of the tibia and fibula with medial and dorsal displacement. IMPRESSION: Ankle and hindfoot negative.  See above. Electronically Signed   By: Nelson Chimes M.D.   On: 09/17/2016 13:17    Review of Systems  Constitutional: Negative for weight loss.  HENT: Negative for ear discharge, ear pain, hearing loss and tinnitus.   Eyes: Negative for blurred vision, double vision, photophobia and pain.  Respiratory: Negative for cough, sputum production and shortness of breath.   Cardiovascular: Negative for chest pain.  Gastrointestinal: Negative for abdominal pain, nausea and vomiting.  Genitourinary: Negative for dysuria, flank pain, frequency and urgency.  Musculoskeletal: Positive for back pain and joint pain (Left lower leg). Negative for falls, myalgias and neck pain.  Neurological: Negative for dizziness, tingling, sensory change, focal weakness, loss of consciousness and headaches.  Endo/Heme/Allergies: Does not bruise/bleed easily.  Psychiatric/Behavioral: Negative for depression, memory loss and substance abuse. The patient is not nervous/anxious.     Blood pressure 108/89, pulse (!) 127, temperature 99.2 F (37.3 C), temperature source Oral, resp. rate 15, height 6' (1.829 m), weight 93 kg (205 lb), SpO2 94 %. Physical Exam  Constitutional: He appears well-developed and well-nourished. No distress.  HENT:  Head: Normocephalic and atraumatic.  Eyes: Conjunctivae are normal. Right eye exhibits no discharge. Left eye exhibits no discharge. No scleral icterus.  Neck: Normal range  of motion. Neck supple.  Cardiovascular: Regular rhythm.  Tachycardia present.   Respiratory: Effort normal. No respiratory distress.  GI: Soft.  Musculoskeletal:  UEx shoulder, elbow, wrist, digits- no skin wounds, nontender, no instability, no blocks to motion  Sens  Ax/R/M/U intact  Mot   Ax/ R/ PIN/ M/ AIN/ U intact  Rad 2+  RLE No traumatic wounds, ecchymosis, or rash  Nontender  No effusions  Knee stable to varus/ valgus and anterior/posterior stress  Sens DPN, SPN, TN intact  Motor EHL, ext, flex, evers 5/5  DP 2+, PT 2+, No significant edema   LLE No traumatic wounds, ecchymosis, or rash  In short leg splint  No effusions  Knee stable to varus/ valgus and anterior/posterior stress  Sens DPN, SPN, TN intact  Motor EHL 5/5  Lymphadenopathy:    He has no cervical adenopathy.  Skin: He is not diaphoretic.    Assessment/Plan: Blunt trauma Pelvic ring fx -- Additional workup pending. Non-op vs screw fixation. NWB LLE. Left tib/fib fx -- Dr. Lynann Bologna to evaluate, will need IMN. NWB. Trauma issues -- Agree with trauma admit    Lisette Abu, PA-C Orthopedic Surgery [phone] 09/17/2016, 3:30 PM

## 2016-09-17 NOTE — Brief Op Note (Signed)
09/17/2016  8:31 PM  PATIENT:  Peter Morrow  36 y.o. male  PRE-OPERATIVE DIAGNOSIS:   1. LEFT SEGMENTAL TIBIA FRACTURE, DISTAL SHAFT COMMINUTION 2. PELVIC RING INSTABILITY WITH DISRUPTED PUBIC SYMPHYSIS AND OPEN ANTERIOR LEFT SACROILIAC JOINT  POST-OPERATIVE DIAGNOSIS:   1. LEFT SEGMENTAL TIBIA FRACTURE, DISTAL SHAFT COMMINUTION 2. PELVIC RING INSTABILITY WITH DISRUPTED PUBIC SYMPHYSIS AND OPEN ANTERIOR LEFT SACROILIAC JOINT 3. STABLE LEFT ANKLE SYNDESMOSIS 4. STABLE LEFT KNEE 5. LEFT FLANK INTERNAL DEGLOVING WITH HEMATOMA (MORELE-LAVALLE LESION)  PROCEDURE:  Procedure(s): 1. INTRAMEDULLARY (IM) NAIL TIBIAL (Left) with Biomet Versanail 9x331mm, statically locked 2. SACRO-ILIAC PINNING (Left)  3. INCISION AND DRAINAGE OF LEFT FLANK HEMATOMA, 650CC 4. STRESS VIEW PELVIC RING 5. STRESS VIEW LEFT ANKLE SYNDESMOSIS  SURGEON:  Surgeon(s) and Role:    * Myrene Galas, MD - Primary  PHYSICIAN ASSISTANT: Montez Morita, PAC  ANESTHESIA:   general  EBL:  Total I/O In: 1600 [I.V.:1600] Out: 300 [Urine:200; Blood:100]  BLOOD ADMINISTERED: 2 uPRBC  DRAINS: chest tube in the left thigh and flank to suction   LOCAL MEDICATIONS USED:  NONE  SPECIMEN:  No Specimen  DISPOSITION OF SPECIMEN:  N/A  COUNTS:  YES  TOURNIQUET:  * No tourniquets in log *  DICTATION: .Other Dictation: Dictation Number [id number]  PLAN OF CARE: Admit to inpatient   PATIENT DISPOSITION:  PACU - hemodynamically stable.   Delay start of Pharmacological VTE agent (>24hrs) due to surgical blood loss or risk of bleeding: no

## 2016-09-17 NOTE — ED Notes (Signed)
OR called bed 34 ready for pt. EDP made aware OR wants pt to come up now. MD made aware, hold blood.

## 2016-09-17 NOTE — ED Notes (Signed)
Pt valuables sent to security, call phone, wedding band, and chap stick. Yellow sheet tubed to station 25 per short stay.

## 2016-09-17 NOTE — Progress Notes (Signed)
Orthopedic Tech Progress Note Patient Details:  Peter Morrow 1980-06-16 [id number]  Ortho Devices Type of Ortho Device: Ace wrap, Post (short leg) splint, Stirrup splint Ortho Device/Splint Location: lle Ortho Device/Splint Interventions: Application   Peter Morrow 09/17/2016, 2:47 PM Made level 2 trauma visit

## 2016-09-17 NOTE — ED Notes (Signed)
ED Provider at bedside. 

## 2016-09-17 NOTE — ED Notes (Signed)
XR at bedside

## 2016-09-17 NOTE — ED Notes (Signed)
Pt able to move left toes in immobilization placed by EMS. He reports pain in left leg, pain that radiates to his back and some pain in left groin.

## 2016-09-17 NOTE — H&P (Signed)
History   Peter Morrow is an 36 y.o. male.   Chief Complaint:  Chief Complaint  Patient presents with  . Leg Injury  . Flank Pain    HPI  Peter Morrow is a 36yo male brought to [facility] via EMS earlier this morning after being struck by a falling tree limb. No LOC. He was struck on his left hip/flank and fell awkwardly on his left leg. He reports immediate pain in his left hip and lower leg. Pain is constant and worse with movement or palpation. States that his foot feels cold but he is able to move it and has no n/t. Denies any prior hip injury. Patient has been consistently tachycardic since in the ED. The only thing he has had to eat/drink today is coffee.  PMH significant for anxiety Nonsmoker Works at [employer] for [employer]  No past medical history on file.  No past surgical history on file.  No family history on file. Social History:  reports that he has quit smoking. He has quit using smokeless tobacco. He reports that he drinks about 3.0 oz of alcohol per week . He reports that he does not use drugs.  Allergies  No Known Allergies  Home Medications   (Not in a hospital admission)  Trauma Course   Results for orders placed or performed during the hospital encounter of 09/17/16 (from the past 48 hour(s))  Sample to Blood Bank     Status: None   Collection Time: 09/17/16 12:15 PM  Result Value Ref Range   Blood Bank Specimen SAMPLE AVAILABLE FOR TESTING    Sample Expiration 09/18/2016   CBC with Differential     Status: Abnormal   Collection Time: 09/17/16 12:15 PM  Result Value Ref Range   WBC 25.0 (H) 4.0 - 10.5 K/uL   RBC 5.20 4.22 - 5.81 MIL/uL   Hemoglobin 14.7 13.0 - 17.0 g/dL   HCT 44.0 39.0 - 52.0 %   MCV 84.6 78.0 - 100.0 fL   MCH 28.3 26.0 - 34.0 pg   MCHC 33.4 30.0 - 36.0 g/dL   RDW 13.3 11.5 - 15.5 %   Platelets 232 150 - 400 K/uL   Neutrophils Relative % 84 %   Lymphocytes Relative 10 %   Monocytes Relative 6 %   Eosinophils Relative 0 %    Basophils Relative 0 %   Neutro Abs 21.0 (H) 1.7 - 7.7 K/uL   Lymphs Abs 2.5 0.7 - 4.0 K/uL   Monocytes Absolute 1.5 (H) 0.1 - 1.0 K/uL   Eosinophils Absolute 0.0 0.0 - 0.7 K/uL   Basophils Absolute 0.0 0.0 - 0.1 K/uL   WBC Morphology MILD LEFT SHIFT (1-5% METAS, OCC MYELO, OCC BANDS)    Smear Review LARGE PLATELETS PRESENT   Basic metabolic panel     Status: Abnormal   Collection Time: 09/17/16 12:15 PM  Result Value Ref Range   Sodium 138 135 - 145 mmol/L   Potassium 3.6 3.5 - 5.1 mmol/L   Chloride 104 101 - 111 mmol/L   CO2 23 22 - 32 mmol/L   Glucose, Bld 162 (H) 65 - 99 mg/dL   BUN 15 6 - 20 mg/dL   Creatinine, Ser 1.29 (H) 0.61 - 1.24 mg/dL   Calcium 8.8 (L) 8.9 - 10.3 mg/dL   GFR calc non Af Amer >60 >60 mL/min   GFR calc Af Amer >60 >60 mL/min    Comment: (NOTE) The eGFR has been calculated using the CKD EPI equation. This  calculation has not been validated in all clinical situations. eGFR's persistently <60 mL/min signify possible Chronic Kidney Disease.    Anion gap 11 5 - 15  I-stat Chem 8, ED     Status: Abnormal   Collection Time: 09/17/16 12:26 PM  Result Value Ref Range   Sodium 139 135 - 145 mmol/L   Potassium 3.5 3.5 - 5.1 mmol/L   Chloride 103 101 - 111 mmol/L   BUN 18 6 - 20 mg/dL   Creatinine, Ser 1.30 (H) 0.61 - 1.24 mg/dL   Glucose, Bld 161 (H) 65 - 99 mg/dL   Calcium, Ion 1.08 (L) 1.15 - 1.40 mmol/L   TCO2 25 0 - 100 mmol/L   Hemoglobin 15.0 13.0 - 17.0 g/dL   HCT 44.0 39.0 - 52.0 %   Ct Abdomen Pelvis W Contrast  Result Date: 09/17/2016 CLINICAL DATA:  Struck by a falling tree. Left-sided hip and pelvic pain. EXAM: CT ABDOMEN AND PELVIS WITH CONTRAST TECHNIQUE: Multidetector CT imaging of the abdomen and pelvis was performed using the standard protocol following bolus administration of intravenous contrast. CONTRAST:  100 cc Isovue-300 COMPARISON:  Plain radiography same day FINDINGS: Lower chest: Mild atelectasis at both dependent lung bases. No  pneumothorax or hemothorax. No pericardial fluid. Hepatobiliary: Liver parenchyma is normal. No calcified gallstones. No ductal dilatation. Pancreas: Normal Spleen: Normal Adrenals/Urinary Tract: Adrenal glands are normal. Right kidney is normal. 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 No sign of renal injury. Bladder appears intact. Pelvic hematoma external to the bladder. See below. Stomach/Bowel: Normal Vascular/Lymphatic: Aorta and IVC are normal. No retroperitoneal mass or lymphadenopathy. See below for traumatic findings in the pelvis. Reproductive: Normal Other: No free intraperitoneal fluid or air. Musculoskeletal: Minimal displaced fractures of the left second, third, fourth and fifth transverse processes. Nondisplaced right sacral fracture. Even possible this could be old. Favor at least some acute component. Mild diastases of the left sacroiliac joint. Mild diet stasis at the pubic symphysis. Adjacent active extravasation is evident. Extraperitoneal hematoma in the anterior and left pelvis with some mass effect upon the bladder. Some bleeding into the paraspinous musculature relative to the transverse process fractures. Left lower abdominal musculature hematoma and subcutaneous fat hematoma and hematoma within the soft tissues lateral to the left hip. IMPRESSION: Lumbar spine left transverse process fractures 2 through 5. Some associated muscular hemorrhage. Mild diastases of the left sacroiliac joint and symphysis pubis. Active arterial extravasation adjacent to the symphysis pubis with extraperitoneal hemorrhage with mass-effect upon the anterior bladder, extending along the left pelvic sidewall. Abnormal appearance of the right sacrum. I question whether this is an old sacral fracture. If there is not a clear history of old pelvic injury, this must be presumed represent an acute right sacral fracture. Soft tissue hemorrhage affecting the left-sided abdominal wall musculature in  superficial fat and the superficial fat lateral to the left hip. Left pelvic kidney incidentally noted. Critical Value/emergent results were called by telephone at the time of interpretation on 09/17/2016 at 2:25 pm to Dr. Lajean Saver , who verbally acknowledged these results. Electronically Signed   By: Nelson Chimes M.D.   On: 09/17/2016 14:26   Dg Pelvis Portable  Result Date: 09/17/2016 CLINICAL DATA:  Struck by a falling tree. Left-sided pelvic and hip pain. EXAM: PORTABLE PELVIS 1-2 VIEWS COMPARISON:  None. FINDINGS: Offset of the symphysis pubis with the left side being lower than the right. No clear sacral fracture visible on this one view.  Question struck disruption on the right. CT scan suggested. IMPRESSION: Offset at the symphysis pubis indicating pelvic ring disruption. Question sacral strut disruption on the right. CT recommended. Electronically Signed   By: Nelson Chimes M.D.   On: 09/17/2016 13:14   Dg Chest Port 1 View  Result Date: 09/17/2016 CLINICAL DATA:  Initial encounter for Pt hit by a falling tree today - tree hit left side, mostly lower extremity area - not having any chest pain at this time, just some lower back pain as well as lower leg - no heart related issues known EXAM: PORTABLE CHEST 1 VIEW COMPARISON:  04/05/2012 FINDINGS: Numerous leads and wires project over the chest. Midline trachea. Normal heart size and mediastinal contours. No pleural effusion or pneumothorax. Mild low low lung volumes. Clear lungs. IMPRESSION: No acute cardiopulmonary disease. Electronically Signed   By: Abigail Miyamoto M.D.   On: 09/17/2016 13:12   Dg Tibia/fibula Left Port  Result Date: 09/17/2016 CLINICAL DATA:  Struck by a falling tree. Lower extremity pain and deformity. EXAM: PORTABLE LEFT TIBIA AND FIBULA - 2 VIEW COMPARISON:  None. FINDINGS: Complete transverse fractures of the tibia and fibula at the junction of the mid and distal thirds. Distal fragments displaced medially and posteriorly.  Minor combination at these fracture sites. Nondisplaced fractures of the proximal tibia at the junction of the metaphysis and diaphysis. IMPRESSION: Complete transverse fractures of the tibia and fibula at the junction of the mid and distal thirds with displacement. Nondisplaced fractures also of the proximal tibia at the metaphyseal diaphyseal junction. Electronically Signed   By: Nelson Chimes M.D.   On: 09/17/2016 13:16   Dg Ankle Left Port  Result Date: 09/17/2016 CLINICAL DATA:  Struck by a falling tree.  Ankle pain. EXAM: PORTABLE LEFT ANKLE - 2 VIEW COMPARISON:  Tib-fib earlier same day FINDINGS: No ankle or hindfoot abnormality. See tib-fib report for description of complete transverse fractures of the tibia and fibula with medial and dorsal displacement. IMPRESSION: Ankle and hindfoot negative.  See above. Electronically Signed   By: Nelson Chimes M.D.   On: 09/17/2016 13:17    Review of Systems  Constitutional: Negative.   HENT: Negative.   Eyes: Negative.   Respiratory: Negative.   Cardiovascular: Negative.   Gastrointestinal: Negative.   Genitourinary: Negative.   Musculoskeletal: Positive for joint pain.       Left hip and lower leg  Skin: Negative.   Neurological: Negative.     Blood pressure 112/83, pulse (!) 136, temperature 99.2 F (37.3 C), temperature source Oral, resp. rate 14, height 6' (1.829 m), weight 205 lb (93 kg), SpO2 97 %. Physical Exam  Nursing note and vitals reviewed. Constitutional: He is oriented to person, place, and time. He appears well-developed and well-nourished. No distress.  HENT:  Head: Normocephalic and atraumatic.  Right Ear: External ear normal.  Left Ear: External ear normal.  Nose: Nose normal.  Eyes: Conjunctivae and EOM are normal. Pupils are equal, round, and reactive to light. No scleral icterus.  Neck: Normal range of motion. Neck supple. No tracheal deviation present.  Cardiovascular: Regular rhythm, normal heart sounds and intact  distal pulses.   No murmur heard. tachycardic  Respiratory: Effort normal and breath sounds normal. No respiratory distress. He has no wheezes. He exhibits no tenderness.  GI: Soft. Bowel sounds are normal. He exhibits no distension and no mass. There is no tenderness. There is no rebound and no guarding.  Musculoskeletal:  Tenderness and edema noted  distal 1/3 left lower leg. SILT and motor function intact distally. No open wounds. Pelvis stable but tender left hip and anterior pelvis. Large hematoma and edema left flank with trace bloody drainage.  Neurological: He is alert and oriented to person, place, and time. No cranial nerve deficit.  Psychiatric: He has a normal mood and affect. His behavior is normal. Judgment and thought content normal.     Assessment/Plan Struck by tree/falling limb Left SI joint and symphysis pubis diastases Lumbar spine left transverse process fractures 2-5 Nondisplaced right sacral fracture Left displaced distal 1/3 tibia/fibula fractures, nondisplaced left proximal tibia fracture Arterial extravasation adjacent to pubic symphysis with extraperitoneal hemorrhage - Hg 14.7 at 1215 on 09/17/16 AKI - Cr 1.3, continue IVF replacement Leukocytosis - WBC 25.0, afebrile, recheck in AM  H/o anxiety  Plan - Admit to ICU for monitoring. Continue IV fluid replacement. Check H&H q6 hours over night. Ortho consult pending for the above mentioned ortho injuries.   BROOKE A MILLER 09/17/2016, 2:16 PM   Procedures: none

## 2016-09-17 NOTE — Anesthesia Preprocedure Evaluation (Addendum)
Anesthesia Evaluation  Patient identified by MRN, date of birth, ID band Patient awake    Reviewed: Allergy & Precautions, NPO status , Patient's Chart, lab work & pertinent test results  History of Anesthesia Complications Negative for: history of anesthetic complications  Airway Mallampati: II  TM Distance: >3 FB Neck ROM: Full    Dental  (+) Dental Advisory Given   Pulmonary former smoker,    Pulmonary exam normal breath sounds clear to auscultation       Cardiovascular negative cardio ROS   Rhythm:Regular Rate:Tachycardia     Neuro/Psych negative neurological ROS  negative psych ROS   GI/Hepatic negative GI ROS, Neg liver ROS,   Endo/Other  negative endocrine ROS  Renal/GU negative Renal ROS     Musculoskeletal   Abdominal   Peds  Hematology negative hematology ROS (+)   Anesthesia Other Findings   Reproductive/Obstetrics                            Anesthesia Physical Anesthesia Plan  ASA: II and emergent  Anesthesia Plan: General   Post-op Pain Management:    Induction: Intravenous, Rapid sequence and Cricoid pressure planned  Airway Management Planned: Oral ETT  Additional Equipment:   Intra-op Plan:   Post-operative Plan: Possible Post-op intubation/ventilation and Extubation in OR  Informed Consent: I have reviewed the patients History and Physical, chart, labs and discussed the procedure including the risks, benefits and alternatives for the proposed anesthesia with the patient or authorized representative who has indicated his/her understanding and acceptance.   Dental advisory given  Plan Discussed with: Surgeon  Anesthesia Plan Comments: (Large bore PIV vs central line depending on stability during induction and case. Possible A line)        Anesthesia Quick Evaluation

## 2016-09-17 NOTE — ED Provider Notes (Signed)
[facility]-EMERGENCY DEPT Provider Note   CSN: [id number] Arrival date & time: 09/17/16  1154   History   Chief Complaint Chief Complaint  Patient presents with  . Leg Injury  . Flank Pain    HPI Peter Morrow is a 36 y.o. male.  Patient presents for for his injury when a tree fell and hit of the left flank and left leg. Patient has deformity of the left tibia-fibula, tenderness of the left hip, large hematoma and swelling of the left flank with small laceration around costovertebral angle no significant hemorrhage. This is uncomfortable past medical history. He reports that he was helping to cut down a tree when the tree fell out of control and struck him on the left side. Patient was not crushed by the tree. Patient had no head trauma, no loss of consciousness, hemodynamically stable. Tachycardic on presentation, pain 610.     No past medical history on file.  Patient Active Problem List   Diagnosis Date Noted  . Injury of pelvis 09/17/2016    No past surgical history on file.     Home Medications    Prior to Admission medications   Medication Sig Start Date End Date Taking? Authorizing Provider  acetaminophen (TYLENOL) 500 MG tablet Take 1,000 mg by mouth every 6 (six) hours as needed.   Yes Historical Provider, MD  vortioxetine HBr (TRINTELLIX) 10 MG TABS Take 10 mg by mouth daily. 04/25/16  Yes Historical Provider, MD  doxycycline (VIBRAMYCIN) 100 MG capsule Take 1 capsule (100 mg total) by mouth 2 (two) times daily. Patient not taking: Reported on 09/17/2016 04/06/12   Paula Libra, MD    Family History No family history on file.  Social History Social History  Substance Use Topics  . Smoking status: Former Games developer  . Smokeless tobacco: Former Neurosurgeon  . Alcohol use 3.0 oz/week    5 Cans of beer per week     Allergies   Patient has no known allergies.   Review of Systems Review of Systems  Constitutional: Negative for chills and fever.  HENT: Negative for  ear pain and sore throat.   Eyes: Negative for pain and visual disturbance.  Respiratory: Negative for cough and shortness of breath.   Cardiovascular: Negative for chest pain and palpitations.  Gastrointestinal: Positive for nausea. Negative for abdominal pain and vomiting.  Genitourinary: Positive for flank pain. Negative for dysuria and hematuria.  Musculoskeletal: Positive for arthralgias, joint swelling and myalgias. Negative for back pain.  Skin: Negative for color change and rash.  Neurological: Negative for dizziness, seizures, syncope, weakness and headaches.  All other systems reviewed and are negative.    Physical Exam Updated Vital Signs BP 112/83   Pulse (!) 136   Temp 99.2 F (37.3 C) (Oral)   Resp 14   Ht 6' (1.829 m)   Wt 93 kg   SpO2 97%   BMI 27.80 kg/m   Physical Exam  Constitutional: He appears well-developed and well-nourished.  HENT:  Head: Normocephalic and atraumatic.  Eyes: Conjunctivae are normal. Pupils are equal, round, and reactive to light.  Neck: Neck supple.  Cardiovascular: Normal rate and regular rhythm.   No murmur heard. Pulmonary/Chest: Effort normal and breath sounds normal. No respiratory distress. He has no wheezes. He has no rales. He exhibits no tenderness.  Abdominal: Soft. Bowel sounds are normal. There is no tenderness.  Musculoskeletal: He exhibits no edema.       Arms: Deformity to left tib/fib. Full ROM of L  toes w/ some movement of L ankle and knee limited by pain. TTP over left lateral hip w/o obvious deformity. RLE grossly intact w/o deformity.  Neurological: He is alert.  Skin: Skin is warm and dry.  Psychiatric: He has a normal mood and affect.  Nursing note and vitals reviewed.  ED Treatments / Results  Labs (all labs ordered are listed, but only abnormal results are displayed) Labs Reviewed  CBC WITH DIFFERENTIAL/PLATELET - Abnormal; Notable for the following:       Result Value   WBC 25.0 (*)    Neutro Abs 21.0  (*)    Monocytes Absolute 1.5 (*)    All other components within normal limits  BASIC METABOLIC PANEL - Abnormal; Notable for the following:    Glucose, Bld 162 (*)    Creatinine, Ser 1.29 (*)    Calcium 8.8 (*)    All other components within normal limits  I-STAT CHEM 8, ED - Abnormal; Notable for the following:    Creatinine, Ser 1.30 (*)    Glucose, Bld 161 (*)    Calcium, Ion 1.08 (*)    All other components within normal limits  HIV ANTIBODY (ROUTINE TESTING)  SAMPLE TO BLOOD BANK  PREPARE RBC (CROSSMATCH)   EKG  EKG Interpretation None      Radiology Ct Abdomen Pelvis W Contrast  Result Date: 09/17/2016 CLINICAL DATA:  Struck by a falling tree. Left-sided hip and pelvic pain. EXAM: CT ABDOMEN AND PELVIS WITH CONTRAST TECHNIQUE: Multidetector CT imaging of the abdomen and pelvis was performed using the standard protocol following bolus administration of intravenous contrast. CONTRAST:  100 cc Isovue-300 COMPARISON:  Plain radiography same day FINDINGS: Lower chest: Mild atelectasis at both dependent lung bases. No pneumothorax or hemothorax. No pericardial fluid. Hepatobiliary: Liver parenchyma is normal. No calcified gallstones. No ductal dilatation. Pancreas: Normal Spleen: Normal Adrenals/Urinary Tract: Adrenal glands are normal. Right kidney is normal. 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 No sign of renal injury. Bladder appears intact. Pelvic hematoma external to the bladder. See below. Stomach/Bowel: Normal Vascular/Lymphatic: Aorta and IVC are normal. No retroperitoneal mass or lymphadenopathy. See below for traumatic findings in the pelvis. Reproductive: Normal Other: No free intraperitoneal fluid or air. Musculoskeletal: Minimal displaced fractures of the left second, third, fourth and fifth transverse processes. Nondisplaced right sacral fracture. Even possible this could be old. Favor at least some acute component. Mild diastases of the left  sacroiliac joint. Mild diet stasis at the pubic symphysis. Adjacent active extravasation is evident. Extraperitoneal hematoma in the anterior and left pelvis with some mass effect upon the bladder. Some bleeding into the paraspinous musculature relative to the transverse process fractures. Left lower abdominal musculature hematoma and subcutaneous fat hematoma and hematoma within the soft tissues lateral to the left hip. IMPRESSION: Lumbar spine left transverse process fractures 2 through 5. Some associated muscular hemorrhage. Mild diastases of the left sacroiliac joint and symphysis pubis. Active arterial extravasation adjacent to the symphysis pubis with extraperitoneal hemorrhage with mass-effect upon the anterior bladder, extending along the left pelvic sidewall. Abnormal appearance of the right sacrum. I question whether this is an old sacral fracture. If there is not a clear history of old pelvic injury, this must be presumed represent an acute right sacral fracture. Soft tissue hemorrhage affecting the left-sided abdominal wall musculature in superficial fat and the superficial fat lateral to the left hip. Left pelvic kidney incidentally noted. Critical Value/emergent results were called by telephone at  the time of interpretation on 09/17/2016 at 2:25 pm to Dr. Cathren Laine , who verbally acknowledged these results. Electronically Signed   By: Paulina Fusi M.D.   On: 09/17/2016 14:26   Dg Pelvis Portable  Result Date: 09/17/2016 CLINICAL DATA:  Struck by a falling tree. Left-sided pelvic and hip pain. EXAM: PORTABLE PELVIS 1-2 VIEWS COMPARISON:  None. FINDINGS: Offset of the symphysis pubis with the left side being lower than the right. No clear sacral fracture visible on this one view. Question struck disruption on the right. CT scan suggested. IMPRESSION: Offset at the symphysis pubis indicating pelvic ring disruption. Question sacral strut disruption on the right. CT recommended. Electronically Signed    By: Paulina Fusi M.D.   On: 09/17/2016 13:14   Dg Chest Port 1 View  Result Date: 09/17/2016 CLINICAL DATA:  Initial encounter for Pt hit by a falling tree today - tree hit left side, mostly lower extremity area - not having any chest pain at this time, just some lower back pain as well as lower leg - no heart related issues known EXAM: PORTABLE CHEST 1 VIEW COMPARISON:  04/05/2012 FINDINGS: Numerous leads and wires project over the chest. Midline trachea. Normal heart size and mediastinal contours. No pleural effusion or pneumothorax. Mild low low lung volumes. Clear lungs. IMPRESSION: No acute cardiopulmonary disease. Electronically Signed   By: Jeronimo Greaves M.D.   On: 09/17/2016 13:12   Dg Tibia/fibula Left Port  Result Date: 09/17/2016 CLINICAL DATA:  Struck by a falling tree. Lower extremity pain and deformity. EXAM: PORTABLE LEFT TIBIA AND FIBULA - 2 VIEW COMPARISON:  None. FINDINGS: Complete transverse fractures of the tibia and fibula at the junction of the mid and distal thirds. Distal fragments displaced medially and posteriorly. Minor combination at these fracture sites. Nondisplaced fractures of the proximal tibia at the junction of the metaphysis and diaphysis. IMPRESSION: Complete transverse fractures of the tibia and fibula at the junction of the mid and distal thirds with displacement. Nondisplaced fractures also of the proximal tibia at the metaphyseal diaphyseal junction. Electronically Signed   By: Paulina Fusi M.D.   On: 09/17/2016 13:16   Dg Ankle Left Port  Result Date: 09/17/2016 CLINICAL DATA:  Struck by a falling tree.  Ankle pain. EXAM: PORTABLE LEFT ANKLE - 2 VIEW COMPARISON:  Tib-fib earlier same day FINDINGS: No ankle or hindfoot abnormality. See tib-fib report for description of complete transverse fractures of the tibia and fibula with medial and dorsal displacement. IMPRESSION: Ankle and hindfoot negative.  See above. Electronically Signed   By: Paulina Fusi M.D.   On:  09/17/2016 13:17    Procedures Procedures (including critical care time)  Medications Ordered in ED Medications  iopamidol (ISOVUE-300) 61 % injection (not administered)  0.9 %  sodium chloride infusion (not administered)  0.45 % sodium chloride infusion (not administered)  ondansetron (ZOFRAN) tablet 4 mg (not administered)    Or  ondansetron (ZOFRAN) injection 4 mg (not administered)  HYDROmorphone (DILAUDID) injection 0.5-1 mg (not administered)  acetaminophen (TYLENOL) tablet 650 mg (not administered)  HYDROmorphone (DILAUDID) injection 1 mg (1 mg Intravenous Given 09/17/16 1226)  ondansetron (ZOFRAN) injection 4 mg (4 mg Intravenous Given 09/17/16 1226)  sodium chloride 0.9 % bolus 1,000 mL (1,000 mLs Intravenous New Bag/Given 09/17/16 1349)  sodium chloride 0.9 % bolus 1,000 mL (1,000 mLs Intravenous New Bag/Given 09/17/16 1349)  HYDROmorphone (DILAUDID) injection 1 mg (1 mg Intravenous Given 09/17/16 1432)    Initial Impression / Assessment  and Plan / ED Course  I have reviewed the triage vital signs and the nursing notes.  Pertinent labs & imaging results that were available during my care of the patient were reviewed by me and considered in my medical decision making (see chart for details).  Clinical Course as of Sep 17 1452  Tue Sep 17, 2016  1219 Pt seen and evaluated. Glancing blow from falling tree to L flank and then L leg. Deformity of L tib/fib, TTP over L hip. Large hematoma of L flank w/ small laceration. Will get LLE tib/fib, ankle, pelvis XRs, CXR, and CT A/P  [BS]  1223 Pain 6/10, felt nauseas with narcotics from EMS. Declines medication currently. Will continue to re-eval.  [BS]  1225 Appears HDS w/ tachycardia 2/2 acute pain. Will get CBC/BMP, and Type and hold until extent of bleeding can be assessed. Pulse Rate: (!) 128 [BS]  [id number] Chemistry remarkable for mild AKI, CBC with traumatic leukocytosis and normal Hgb. WBC: (!) 25.0 [BS]  1414 Upgraded to Level 2  trauma w/ persistant tachycardia in spite of pain control.  [BS]  1415 Closed, dislocated complete L tib / fib fx. DG Tibia/Fibula Left Port [BS]  1415 Pelvic displacement DG Pelvis Portable [BS]  1420 Giving IVF bolus for extraperitoneal bleeding, tachycardia. BP remains stable.  [BS]  1430 Pelvic bleed, transverse process fxs, sacral displacement, pelvic displacement. Incidental pelvic kidney. CT Abdomen Pelvis W Contrast [BS]  1450 Trauma team saw pt. To admit to ICU for obs given pelvic fx and bleed.  [BS]  1452 Ortho tech placed splint. Ortho team to see for surgical recs re: tib/fib fx  [BS]    Clinical Course User Index [BS] Carolynn Comment, MD   Trauma to admit.  Final Clinical Impressions(s) / ED Diagnoses   Final diagnoses:  Blunt trauma  Blunt trauma    New Prescriptions New Prescriptions   No medications on file     Carolynn Comment, MD 09/17/16 1454    Cathren Laine, MD 09/17/16 1558

## 2016-09-17 NOTE — ED Notes (Signed)
Ortho paged. 

## 2016-09-17 NOTE — Anesthesia Procedure Notes (Signed)
Procedure Name: Intubation Date/Time: 09/17/2016 5:42 PM Performed by: Rejeana Brock L Pre-anesthesia Checklist: Patient identified, Emergency Drugs available, Suction available and Patient being monitored Patient Re-evaluated:Patient Re-evaluated prior to inductionOxygen Delivery Method: Circle System Utilized Preoxygenation: Pre-oxygenation with 100% oxygen Intubation Type: IV induction Ventilation: Mask ventilation without difficulty Laryngoscope Size: Mac and 4 Grade View: Grade II Tube type: Oral Tube size: 7.5 mm Number of attempts: 1 Airway Equipment and Method: Stylet and Oral airway Placement Confirmation: ETT inserted through vocal cords under direct vision,  positive ETCO2 and breath sounds checked- equal and bilateral Secured at: 23 cm Tube secured with: Tape Dental Injury: Teeth and Oropharynx as per pre-operative assessment

## 2016-09-17 NOTE — Transfer of Care (Signed)
Immediate Anesthesia Transfer of Care Note  Patient: Peter Morrow  Procedure(s) Performed: Procedure(s): INTRAMEDULLARY (IM) NAIL TIBIAL (Left) SACRO-ILIAC PINNING (Left)  Patient Location: PACU  Anesthesia Type:General  Level of Consciousness: oriented, sedated and patient cooperative  Airway & Oxygen Therapy: Patient Spontanous Breathing and Patient connected to nasal cannula oxygen  Post-op Assessment: Report given to RN, Post -op Vital signs reviewed and stable and Patient moving all extremities X 4  Post vital signs: Reviewed and stable  Last Vitals:  Vitals:   09/17/16 1630 09/17/16 2026  BP: 102/69   Pulse: (!) 137   Resp: 18   Temp:  (P) 36.4 C    Last Pain:  Vitals:   09/17/16 1613  TempSrc:   PainSc: 4          Complications: No apparent anesthesia complications

## 2016-09-17 NOTE — ED Triage Notes (Signed)
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 Was reportedly hit by falling tree limb in L flank and left leg. Pt with deformity to left lower extremity. Pt with significant abrasion and swelling to left flank. Pt received fentanyl, PTA. 18g LAC. Reports feels nauseated.

## 2016-09-18 ENCOUNTER — Encounter (HOSPITAL_COMMUNITY): Payer: Self-pay | Admitting: Orthopedic Surgery

## 2016-09-18 DIAGNOSIS — S32059A Unspecified fracture of fifth lumbar vertebra, initial encounter for closed fracture: Secondary | ICD-10-CM | POA: Diagnosis present

## 2016-09-18 DIAGNOSIS — R110 Nausea: Secondary | ICD-10-CM | POA: Diagnosis present

## 2016-09-18 DIAGNOSIS — T148XXA Other injury of unspecified body region, initial encounter: Secondary | ICD-10-CM | POA: Diagnosis not present

## 2016-09-18 DIAGNOSIS — M7989 Other specified soft tissue disorders: Secondary | ICD-10-CM | POA: Diagnosis not present

## 2016-09-18 DIAGNOSIS — Y929 Unspecified place or not applicable: Secondary | ICD-10-CM | POA: Diagnosis not present

## 2016-09-18 DIAGNOSIS — R0682 Tachypnea, not elsewhere classified: Secondary | ICD-10-CM

## 2016-09-18 DIAGNOSIS — D72829 Elevated white blood cell count, unspecified: Secondary | ICD-10-CM | POA: Diagnosis not present

## 2016-09-18 DIAGNOSIS — E46 Unspecified protein-calorie malnutrition: Secondary | ICD-10-CM | POA: Diagnosis not present

## 2016-09-18 DIAGNOSIS — E559 Vitamin D deficiency, unspecified: Secondary | ICD-10-CM | POA: Diagnosis present

## 2016-09-18 DIAGNOSIS — D62 Acute posthemorrhagic anemia: Secondary | ICD-10-CM | POA: Diagnosis not present

## 2016-09-18 DIAGNOSIS — F329 Major depressive disorder, single episode, unspecified: Secondary | ICD-10-CM | POA: Diagnosis present

## 2016-09-18 DIAGNOSIS — S3993XD Unspecified injury of pelvis, subsequent encounter: Secondary | ICD-10-CM

## 2016-09-18 DIAGNOSIS — S32810D Multiple fractures of pelvis with stable disruption of pelvic ring, subsequent encounter for fracture with routine healing: Secondary | ICD-10-CM

## 2016-09-18 DIAGNOSIS — R269 Unspecified abnormalities of gait and mobility: Secondary | ICD-10-CM | POA: Diagnosis not present

## 2016-09-18 DIAGNOSIS — S7002XA Contusion of left hip, initial encounter: Secondary | ICD-10-CM | POA: Diagnosis present

## 2016-09-18 DIAGNOSIS — Y990 Civilian activity done for income or pay: Secondary | ICD-10-CM | POA: Diagnosis not present

## 2016-09-18 DIAGNOSIS — Z79899 Other long term (current) drug therapy: Secondary | ICD-10-CM | POA: Diagnosis not present

## 2016-09-18 DIAGNOSIS — F32A Depression, unspecified: Secondary | ICD-10-CM

## 2016-09-18 DIAGNOSIS — Y9389 Activity, other specified: Secondary | ICD-10-CM | POA: Diagnosis not present

## 2016-09-18 DIAGNOSIS — R000 Tachycardia, unspecified: Secondary | ICD-10-CM

## 2016-09-18 DIAGNOSIS — N179 Acute kidney failure, unspecified: Secondary | ICD-10-CM | POA: Diagnosis present

## 2016-09-18 DIAGNOSIS — S82402A Unspecified fracture of shaft of left fibula, initial encounter for closed fracture: Secondary | ICD-10-CM | POA: Diagnosis present

## 2016-09-18 DIAGNOSIS — E876 Hypokalemia: Secondary | ICD-10-CM | POA: Diagnosis not present

## 2016-09-18 DIAGNOSIS — S82262A Displaced segmental fracture of shaft of left tibia, initial encounter for closed fracture: Secondary | ICD-10-CM | POA: Diagnosis present

## 2016-09-18 DIAGNOSIS — W208XXA Other cause of strike by thrown, projected or falling object, initial encounter: Secondary | ICD-10-CM | POA: Diagnosis present

## 2016-09-18 DIAGNOSIS — G4700 Insomnia, unspecified: Secondary | ICD-10-CM | POA: Diagnosis not present

## 2016-09-18 DIAGNOSIS — Z87891 Personal history of nicotine dependence: Secondary | ICD-10-CM | POA: Diagnosis not present

## 2016-09-18 DIAGNOSIS — R8290 Unspecified abnormal findings in urine: Secondary | ICD-10-CM | POA: Diagnosis not present

## 2016-09-18 DIAGNOSIS — T1490XA Injury, unspecified, initial encounter: Secondary | ICD-10-CM | POA: Diagnosis not present

## 2016-09-18 DIAGNOSIS — E8889 Other specified metabolic disorders: Secondary | ICD-10-CM | POA: Diagnosis present

## 2016-09-18 DIAGNOSIS — R739 Hyperglycemia, unspecified: Secondary | ICD-10-CM | POA: Diagnosis not present

## 2016-09-18 DIAGNOSIS — S32810A Multiple fractures of pelvis with stable disruption of pelvic ring, initial encounter for closed fracture: Secondary | ICD-10-CM

## 2016-09-18 DIAGNOSIS — S32810S Multiple fractures of pelvis with stable disruption of pelvic ring, sequela: Secondary | ICD-10-CM | POA: Diagnosis not present

## 2016-09-18 DIAGNOSIS — G8918 Other acute postprocedural pain: Secondary | ICD-10-CM

## 2016-09-18 DIAGNOSIS — S82102A Unspecified fracture of upper end of left tibia, initial encounter for closed fracture: Secondary | ICD-10-CM | POA: Diagnosis present

## 2016-09-18 DIAGNOSIS — F411 Generalized anxiety disorder: Secondary | ICD-10-CM | POA: Diagnosis not present

## 2016-09-18 DIAGNOSIS — S3210XA Unspecified fracture of sacrum, initial encounter for closed fracture: Secondary | ICD-10-CM | POA: Diagnosis present

## 2016-09-18 DIAGNOSIS — K5901 Slow transit constipation: Secondary | ICD-10-CM | POA: Diagnosis not present

## 2016-09-18 DIAGNOSIS — S301XXA Contusion of abdominal wall, initial encounter: Secondary | ICD-10-CM | POA: Diagnosis present

## 2016-09-18 LAB — CBC
HCT: 32.1 % — ABNORMAL LOW (ref 39.0–52.0)
Hemoglobin: 10.7 g/dL — ABNORMAL LOW (ref 13.0–17.0)
MCH: 28.2 pg (ref 26.0–34.0)
MCHC: 33.3 g/dL (ref 30.0–36.0)
MCV: 84.5 fL (ref 78.0–100.0)
PLATELETS: 142 10*3/uL — AB (ref 150–400)
RBC: 3.8 MIL/uL — AB (ref 4.22–5.81)
RDW: 13.8 % (ref 11.5–15.5)
WBC: 10.6 10*3/uL — AB (ref 4.0–10.5)

## 2016-09-18 LAB — BPAM FFP
Blood Product Expiration Date: 201804152359
Blood Product Expiration Date: 201804152359
ISSUE DATE / TIME: 201804101746
ISSUE DATE / TIME: 201804101746
Unit Type and Rh: 6200
Unit Type and Rh: 6200

## 2016-09-18 LAB — BASIC METABOLIC PANEL
ANION GAP: 7 (ref 5–15)
BUN: 15 mg/dL (ref 6–20)
CO2: 24 mmol/L (ref 22–32)
CREATININE: 1.35 mg/dL — AB (ref 0.61–1.24)
Calcium: 7.7 mg/dL — ABNORMAL LOW (ref 8.9–10.3)
Chloride: 106 mmol/L (ref 101–111)
GFR calc non Af Amer: >60 mL/min (ref >60)
Glucose, Bld: 160 mg/dL — ABNORMAL HIGH (ref 65–99)
POTASSIUM: 4.9 mmol/L (ref 3.5–5.1)
SODIUM: 137 mmol/L (ref 135–145)

## 2016-09-18 LAB — PREPARE FRESH FROZEN PLASMA
UNIT DIVISION: 0
Unit division: 0

## 2016-09-18 LAB — HEMOGLOBIN AND HEMATOCRIT, BLOOD
HCT: 30.2 % — ABNORMAL LOW (ref 39.0–52.0)
Hemoglobin: 10 g/dL — ABNORMAL LOW (ref 13.0–17.0)

## 2016-09-18 LAB — HIV ANTIBODY (ROUTINE TESTING W REFLEX): HIV Screen 4th Generation wRfx: NONREACTIVE

## 2016-09-18 LAB — MRSA PCR SCREENING: MRSA BY PCR: NEGATIVE

## 2016-09-18 MED ORDER — HYDROMORPHONE HCL 1 MG/ML IJ SOLN
1.0000 mg | INTRAMUSCULAR | Status: DC | PRN
  Administered 2016-09-18: 1 mg via INTRAVENOUS
  Administered 2016-09-18: 2 mg via INTRAVENOUS
  Filled 2016-09-18: qty 2
  Filled 2016-09-18: qty 1

## 2016-09-18 MED ORDER — ENOXAPARIN SODIUM 40 MG/0.4ML ~~LOC~~ SOLN
40.0000 mg | SUBCUTANEOUS | Status: DC
Start: 2016-09-18 — End: 2016-09-24
  Administered 2016-09-18 – 2016-09-23 (×6): 40 mg via SUBCUTANEOUS
  Filled 2016-09-18 (×6): qty 0.4

## 2016-09-18 MED ORDER — VORTIOXETINE HBR 10 MG PO TABS
10.0000 mg | ORAL_TABLET | Freq: Every day | ORAL | Status: DC
  Administered 2016-09-18 – 2016-09-24 (×7): 10 mg via ORAL
  Filled 2016-09-18 (×7): qty 1

## 2016-09-18 NOTE — Evaluation (Signed)
Physical Therapy Evaluation Patient Details Name: Peter Morrow MRN: [id number] DOB: [birth year] Today's Date: 09/18/2016   History of Present Illness  Pt is a 36 y.o. male admitted to ED on 09/17/16 after a tree fell on him. CT shows L transverse process L2-5 fx, R sacral fx; XR shows L tibia/fibula complete transverse fx. Pt now s/p L tibia IM nail, L SI screw and evacuation of L hip hematoma on 4/11. Pertinent PMH includes anxiety and depression.   Clinical Impression  Pt presents to PT with increased pain, increased anxiety, decreased activity tolerance and an overall decrease in functional mobility secondary to above. PTA, pt indep with all mobility, living at home with wife and child; pt reports family is in town who can provide 24-7 assist if needed. Today, able to stand and pivot to chair with RW and maxA +2; significant anxiety and HR up to 150s with mobility, requiring max encouragement to participate. Educ on WB precautions and importance of mobility. Pt would benefit from continued acute PT services to maximize functional mobility and independence.    Follow Up Recommendations CIR;Supervision for mobility/OOB    Equipment Recommendations  Rolling walker with 5" wheels    Recommendations for Other Services Rehab consult;OT consult     Precautions / Restrictions Precautions Precautions: Fall Precaution Comments: Chest tube to suction (for sacrum) Restrictions Weight Bearing Restrictions: Yes RLE Weight Bearing: Weight bearing as tolerated LLE Weight Bearing: Touchdown weight bearing Other Position/Activity Restrictions: Per RN, orthopedic MD updated orders this A.M. for pt to be WBAT on RLE and TDWB on LLE.       Mobility  Bed Mobility Overal bed mobility: Needs Assistance Bed Mobility: Supine to Sit     Supine to sit: Mod assist;HOB elevated     General bed mobility comments: Significant increased time for bed mobility with modA to assist LLE off bed and provide UE  support; pt extremely anxious with movement and HR up to 150s  Transfers Overall transfer level: Needs assistance Equipment used: Rolling walker (2 wheeled) Transfers: Sit to/from [employer] Sit to Stand: Max assist;+2 physical assistance;+2 safety/equipment;From elevated surface Stand pivot transfers: Max assist       General transfer comment: Pt able to stand on 4th attempt with RW, maxA+2 and bed raised, reliant on BUE support and momentum to come into standing. Stand pivot to w/c with RW and max+2 and max encouragement. C/o dizziness with standing which recovered in sitting  Ambulation/Gait                Stairs            Wheelchair Mobility    Modified Rankin (Stroke Patients Only)       Balance Overall balance assessment: Needs assistance Sitting-balance support: No upper extremity supported;Feet supported Sitting balance-Leahy Scale: Fair Sitting balance - Comments: Able to sit with no assist.      Standing balance-Leahy Scale: Poor Standing balance comment: Reliant on BUE support for standing.                              Pertinent Vitals/Pain Pain Assessment: Faces Faces Pain Scale: Hurts worst Pain Location: Back Pain Descriptors / Indicators: Aching Pain Intervention(s): Limited activity within patient's tolerance;Monitored during session;Repositioned;Relaxation    Home Living Family/patient expects to be discharged to:: Private residence Living Arrangements: Spouse/significant other Available Help at Discharge: Family;Available 24 hours/day Type of Home: House Home Access: Stairs to enter  Entrance Stairs-Rails: None Entrance Stairs-Number of Steps: 3 Home Layout: Two level;1/2 bath on main level;Bed/bath upstairs;Able to live on main level with bedroom/bathroom (Can live on first floor, but no shower) Home Equipment: None      Prior Function Level of Independence: Independent               Hand  Dominance        Extremity/Trunk Assessment   Upper Extremity Assessment Upper Extremity Assessment: Overall WFL for tasks assessed    Lower Extremity Assessment Lower Extremity Assessment: LLE deficits/detail LLE Deficits / Details: LLE pain and weakness s/p sx LLE: Unable to fully assess due to immobilization       Communication   Communication: No difficulties  Cognition Arousal/Alertness: Awake/alert Behavior During Therapy: Anxious Overall Cognitive Status: Within Functional Limits for tasks assessed                                 General Comments: Pt anxious with all mobility and easily distracted by pain      General Comments General comments (skin integrity, edema, etc.): SpO2 remained >90% on RA. HR 120s-150s.     Exercises     Assessment/Plan    PT Assessment Patient needs continued PT services  PT Problem List Decreased strength;Decreased mobility;Decreased range of motion;Decreased activity tolerance;Cardiopulmonary status limiting activity;Decreased balance;Decreased knowledge of use of DME;Pain       PT Treatment Interventions DME instruction;Therapeutic activities;Patient/family education;Therapeutic exercise;Gait training;Stair training;Balance training;Functional mobility training    PT Goals (Current goals can be found in the Care Plan section)  Acute Rehab PT Goals Patient Stated Goal: Return home PT Goal Formulation: With patient Time For Goal Achievement: 10/02/16 Potential to Achieve Goals: Good    Frequency Min 4X/week   Barriers to discharge Inaccessible home environment Stairs to enter and to bedroom    Co-evaluation               End of Session Equipment Utilized During Treatment: Gait belt Activity Tolerance: Patient limited by pain Patient left: with nursing/sitter in room;Other (comment) (in w/c for transport) Nurse Communication: Mobility status PT Visit Diagnosis: Other abnormalities of gait and mobility  (R26.89);Muscle weakness (generalized) (M62.81)    Time: 1000-1030 PT Time Calculation (min) (ACUTE ONLY): 30 min   Charges:   PT Evaluation $PT Eval Low Complexity: 1 Procedure PT Treatments $Therapeutic Activity: 8-22 mins   PT G Codes:   PT G-Codes **NOT FOR INPATIENT CLASS** Functional Limitation: Mobility: Walking and moving around Mobility: Walking and Moving Around Current Status (Z6109): At least 60 percent but less than 80 percent impaired, limited or restricted Mobility: Walking and Moving Around Goal Status [phone]): At least 40 percent but less than 60 percent impaired, limited or restricted   Dewayne Hatch, SPT Office-[phone]  [employer] 09/18/2016, 1:32 PM

## 2016-09-18 NOTE — Progress Notes (Signed)
Pt tx 5 north per MD orders, pt tol well, pt VSS, pt up to W/C with PT, report called to receiving RN all questions answered,pt report pain 4/10 with movent of L leg and 0/10 at rest

## 2016-09-18 NOTE — Progress Notes (Signed)
Trauma Service Note  Subjective: Patient looks great.  Awake and alert.    Objective: Vital signs in last 24 hours: Temp:  [97.5 F (36.4 C)-99.4 F (37.4 C)] 98.8 F (37.1 C) (04/11 0800) Pulse Rate:  [98-158] 116 (04/11 0800) Resp:  [14-22] 20 (04/11 0800) BP: (100-133)/(65-89) 133/85 (04/11 0800) SpO2:  [92 %-100 %] 95 % (04/11 0800) Weight:  [93 kg (205 lb)] 93 kg (205 lb) (04/10 1341)    Intake/Output from previous day: 04/10 0701 - 04/11 0700 In: 8100.8 [P.O.:360; I.V.:4970.8; Blood:670; IV Piggyback:2100] Out: 2695 [Urine:2175; Drains:420; Blood:100] Intake/Output this shift: Total I/O In: 125 [I.V.:125] Out: 20 [Drains:20]  General: No acute distress  Lungs: Clear to auscultation.  Abd: Soft, mildly distended, good bowel sounds.  Extremities: No changes  Neuro: Intact  Lab Results: CBC   Recent Labs  09/17/16 2038 09/18/16 0223  WBC 15.6* 10.6*  HGB 11.9* 10.7*  HCT 36.4* 32.1*  PLT 157 142*   BMET  Recent Labs  09/17/16 1215 09/17/16 1226 09/18/16 0223  NA 138 139 137  K 3.6 3.5 4.9  CL 104 103 106  CO2 23  --  24  GLUCOSE 162* 161* 160*  BUN CREATININE 1.29* 1.30* 1.35*  CALCIUM 8.8*  --  7.7*   PT/INR No results for input(s): LABPROT, INR in the last 72 hours. ABG No results for input(s): PHART, HCO3 in the last 72 hours.  Invalid input(s): PCO2, PO2  Studies/Results: Dg Si Joints  Result Date: 09/17/2016 CLINICAL DATA:  Left SI joint fusion EXAM: BILATERAL SACROILIAC JOINTS - 3+ VIEW COMPARISON:  09/17/2016 CT and pelvic radiograph from earlier on the same day. FINDINGS: C-arm fluoroscopic views are provided during left SI joint fusion with single cannulated screw traversing the left SI joint. Tip of the screw projects across the left SI joint and into the S1 vertebral body. A total of 1 minutes 3 seconds of fluoroscopic time was utilized. A total of 4 fluoroscopic images are provided. IMPRESSION: Fluoroscopic time  utilized for left SI joint fusion with single cannulated screw. The transversely oriented screw traverses the left SI joint with tip projecting within the S1 vertebral body. Electronically Signed   By: Tollie Eth M.D.   On: 09/17/2016 22:39   Dg Tibia/fibula Left  Result Date: 09/17/2016 CLINICAL DATA:  Left intramedullary nail fixation of the tibia. EXAM: LEFT TIBIA AND FIBULA - 2 VIEW; DG C-ARM 61-120 MIN COMPARISON:  09/17/2016 radiographs of the left leg and ankle FINDINGS: C-arm fluoroscopic views were acquired during intramedullary nail fixation across fractures of the proximal and distal diaphysis of the tibia. A total of 1 minutes 40 seconds of fluoroscopic time was utilized demonstrating intramedullary nail fixation across the tibial fractures with improved alignment. Marked improvement in the displaced appearance of the tibia is noted, closer to anatomic after fixation. Fracture of the distal fibular diaphysis is also noted with slight lateral displacement 1/4 shaft width of wrist fracture fragment at the junction of the middle and distal third and 1 shaft with dorsal displacement. IMPRESSION: Status post ORIF of comminuted proximal and distal diaphyseal fractures of the tibia with improved alignment post fixation. Displaced distal diaphyseal fracture of the fibula as above described. Electronically Signed   By: Tollie Eth M.D.   On: 09/17/2016 22:44   Ct Abdomen Pelvis W Contrast  Result Date: 09/17/2016 CLINICAL DATA:  Struck by a falling tree. Left-sided hip and pelvic pain. EXAM: CT ABDOMEN AND PELVIS WITH CONTRAST  TECHNIQUE: Multidetector CT imaging of the abdomen and pelvis was performed using the standard protocol following bolus administration of intravenous contrast. CONTRAST:  100 cc Isovue-300 COMPARISON:  Plain radiography same day FINDINGS: Lower chest: Mild atelectasis at both dependent lung bases. No pneumothorax or hemothorax. No pericardial fluid. Hepatobiliary: Liver parenchyma  is normal. No calcified gallstones. No ductal dilatation. Pancreas: Normal Spleen: Normal Adrenals/Urinary Tract: Adrenal glands are normal. Right kidney is normal. 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 No sign of renal injury. Bladder appears intact. Pelvic hematoma external to the bladder. See below. Stomach/Bowel: Normal Vascular/Lymphatic: Aorta and IVC are normal. No retroperitoneal mass or lymphadenopathy. See below for traumatic findings in the pelvis. Reproductive: Normal Other: No free intraperitoneal fluid or air. Musculoskeletal: Minimal displaced fractures of the left second, third, fourth and fifth transverse processes. Nondisplaced right sacral fracture. Even possible this could be old. Favor at least some acute component. Mild diastases of the left sacroiliac joint. Mild diet stasis at the pubic symphysis. Adjacent active extravasation is evident. Extraperitoneal hematoma in the anterior and left pelvis with some mass effect upon the bladder. Some bleeding into the paraspinous musculature relative to the transverse process fractures. Left lower abdominal musculature hematoma and subcutaneous fat hematoma and hematoma within the soft tissues lateral to the left hip. IMPRESSION: Lumbar spine left transverse process fractures 2 through 5. Some associated muscular hemorrhage. Mild diastases of the left sacroiliac joint and symphysis pubis. Active arterial extravasation adjacent to the symphysis pubis with extraperitoneal hemorrhage with mass-effect upon the anterior bladder, extending along the left pelvic sidewall. Abnormal appearance of the right sacrum. I question whether this is an old sacral fracture. If there is not a clear history of old pelvic injury, this must be presumed represent an acute right sacral fracture. Soft tissue hemorrhage affecting the left-sided abdominal wall musculature in superficial fat and the superficial fat lateral to the left hip. Left pelvic kidney  incidentally noted. Critical Value/emergent results were called by telephone at the time of interpretation on 09/17/2016 at 2:25 pm to Dr. Cathren Laine , who verbally acknowledged these results. Electronically Signed   By: Paulina Fusi M.D.   On: 09/17/2016 14:26   Dg Pelvis Portable  Result Date: 09/17/2016 CLINICAL DATA:  Struck by a falling tree. Left-sided pelvic and hip pain. EXAM: PORTABLE PELVIS 1-2 VIEWS COMPARISON:  None. FINDINGS: Offset of the symphysis pubis with the left side being lower than the right. No clear sacral fracture visible on this one view. Question struck disruption on the right. CT scan suggested. IMPRESSION: Offset at the symphysis pubis indicating pelvic ring disruption. Question sacral strut disruption on the right. CT recommended. Electronically Signed   By: Paulina Fusi M.D.   On: 09/17/2016 13:14   Dg Pelvis Comp Min 3v  Result Date: 09/17/2016 CLINICAL DATA:  Pelvic ring fracture with fixation across the left SI joint EXAM: JUDET PELVIS - 3+ VIEW COMPARISON:  Preoperative pelvic radiograph from earlier on the same day FINDINGS: Approximately 7.5 mm of offset in the craniocaudad orientation is noted of the pubic symphysis. A single cannulated screw traverses left SI joint. No acute appearing pelvic fracture is identified radiographically. Both femoral heads are seated within their acetabular components. IMPRESSION: Left SI joint fixation with single cannulated screw. Slight offset at the pubic symphysis is again noted. No acute pelvic fracture is apparent. Electronically Signed   By: Tollie Eth M.D.   On: 09/17/2016 22:51   Dg Chest [facility]  1 View  Result Date: 09/17/2016 CLINICAL DATA:  Initial encounter for Pt hit by a falling tree today - tree hit left side, mostly lower extremity area - not having any chest pain at this time, just some lower back pain as well as lower leg - no heart related issues known EXAM: PORTABLE CHEST 1 VIEW COMPARISON:  04/05/2012 FINDINGS:  Numerous leads and wires project over the chest. Midline trachea. Normal heart size and mediastinal contours. No pleural effusion or pneumothorax. Mild low low lung volumes. Clear lungs. IMPRESSION: No acute cardiopulmonary disease. Electronically Signed   By: Jeronimo Greaves M.D.   On: 09/17/2016 13:12   Dg Tibia/fibula Left Port  Result Date: 09/17/2016 CLINICAL DATA:  Postop tibial fixation with intramedullary rod. EXAM: PORTABLE LEFT TIBIA AND FIBULA - 2 VIEW COMPARISON:  C-arm fluoroscopic intraoperative views of the tibia and fibula, preoperative radiographs of the left tibia and fibula from earlier on the same day. FINDINGS: Intramedullary rod fixation across proximal and distal tibial shaft fractures are noted with improved alignment. Of the distal fracture of the tibia is at the junction of the middle and distal third with comminution. No significant displacement is identified. The proximal tibial shaft fracture is just below the tibial tuberosity. A fracture at the junction of the middle and distal third of the fibula is seen with slight posterior and medial displacement of the distal fracture fragment on current study. IMPRESSION: Intramedullary rod fixation across proximal and distal tibial fractures as above described with improved alignment since the preoperative exam. Fracture of the junction of the middle and distal third of the fibula with slight medial and dorsal displacement of the distal fracture fragment. Electronically Signed   By: Tollie Eth M.D.   On: 09/17/2016 22:47   Dg Tibia/fibula Left Port  Result Date: 09/17/2016 CLINICAL DATA:  Struck by a falling tree. Lower extremity pain and deformity. EXAM: PORTABLE LEFT TIBIA AND FIBULA - 2 VIEW COMPARISON:  None. FINDINGS: Complete transverse fractures of the tibia and fibula at the junction of the mid and distal thirds. Distal fragments displaced medially and posteriorly. Minor combination at these fracture sites. Nondisplaced fractures  of the proximal tibia at the junction of the metaphysis and diaphysis. IMPRESSION: Complete transverse fractures of the tibia and fibula at the junction of the mid and distal thirds with displacement. Nondisplaced fractures also of the proximal tibia at the metaphyseal diaphyseal junction. Electronically Signed   By: Paulina Fusi M.D.   On: 09/17/2016 13:16   Dg Ankle Left Port  Result Date: 09/17/2016 CLINICAL DATA:  Struck by a falling tree.  Ankle pain. EXAM: PORTABLE LEFT ANKLE - 2 VIEW COMPARISON:  Tib-fib earlier same day FINDINGS: No ankle or hindfoot abnormality. See tib-fib report for description of complete transverse fractures of the tibia and fibula with medial and dorsal displacement. IMPRESSION: Ankle and hindfoot negative.  See above. Electronically Signed   By: Paulina Fusi M.D.   On: 09/17/2016 13:17   Dg C-arm 61-120 Min  Result Date: 09/17/2016 CLINICAL DATA:  Left intramedullary nail fixation of the tibia. EXAM: LEFT TIBIA AND FIBULA - 2 VIEW; DG C-ARM 61-120 MIN COMPARISON:  09/17/2016 radiographs of the left leg and ankle FINDINGS: C-arm fluoroscopic views were acquired during intramedullary nail fixation across fractures of the proximal and distal diaphysis of the tibia. A total of 1 minutes 40 seconds of fluoroscopic time was utilized demonstrating intramedullary nail fixation across the tibial fractures with improved alignment. Marked improvement in the displaced  appearance of the tibia is noted, closer to anatomic after fixation. Fracture of the distal fibular diaphysis is also noted with slight lateral displacement 1/4 shaft width of wrist fracture fragment at the junction of the middle and distal third and 1 shaft with dorsal displacement. IMPRESSION: Status post ORIF of comminuted proximal and distal diaphyseal fractures of the tibia with improved alignment post fixation. Displaced distal diaphyseal fracture of the fibula as above described. Electronically Signed   By: Tollie Eth M.D.   On: 09/17/2016 22:44    Anti-infectives: Anti-infectives    Start     Dose/Rate Route Frequency Ordered Stop   09/18/16 0000  ceFAZolin (ANCEF) IVPB 1 g/50 mL premix     1 g 100 mL/hr over 30 Minutes Intravenous Every 6 hours 09/17/16 2146 09/18/16 1759      Assessment/Plan: s/p Procedure(s): INTRAMEDULLARY (IM) NAIL TIBIAL INSERTION LEFT SACRO-ILIAC SCREW AND EVACUATION HEMATOMA LEFT HIP Continue foley due to strict I&O Transfer to the floor  Start therapies. Rehab consultation.  LOS: 0 days   Marta Lamas. Gae Bon, MD, FACS [phone] Trauma Surgeon 09/18/2016

## 2016-09-18 NOTE — Anesthesia Postprocedure Evaluation (Signed)
Anesthesia Post Note  Patient: Peter Morrow  Procedure(s) Performed: Procedure(s) (LRB): INTRAMEDULLARY (IM) NAIL TIBIAL (Left) INSERTION LEFT SACRO-ILIAC SCREW AND EVACUATION HEMATOMA LEFT HIP (Left)  Patient location during evaluation: PACU Anesthesia Type: General Level of consciousness: sedated and patient cooperative Pain management: pain level controlled Vital Signs Assessment: post-procedure vital signs reviewed and stable Respiratory status: spontaneous breathing Cardiovascular status: stable Anesthetic complications: no       Last Vitals:  Vitals:   09/18/16 0000 09/18/16 0005  BP:  107/73  Pulse:  (!) 105  Resp:  17  Temp: 37.4 C     Last Pain:  Vitals:   09/18/16 0000  TempSrc: Oral  PainSc:                  [name]
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
## 2016-09-18 NOTE — Progress Notes (Signed)
Rehab Admissions Coordinator Note:  Patient was screened by Clois Dupes for appropriateness for an Inpatient Acute Rehab Consult per PT recommendation.  At this time, we are recommending Inpatient Rehab consult.  Clois Dupes 09/18/2016, 10:47 AM  I can be reached at [phone].

## 2016-09-19 ENCOUNTER — Encounter (HOSPITAL_COMMUNITY): Payer: Self-pay | Admitting: Orthopedic Surgery

## 2016-09-19 DIAGNOSIS — S82262A Displaced segmental fracture of shaft of left tibia, initial encounter for closed fracture: Secondary | ICD-10-CM

## 2016-09-19 HISTORY — DX: Displaced segmental fracture of shaft of left tibia, initial encounter for closed fracture: S82.262A

## 2016-09-19 LAB — BASIC METABOLIC PANEL
ANION GAP: 6 (ref 5–15)
BUN: 9 mg/dL (ref 6–20)
CALCIUM: 7.9 mg/dL — AB (ref 8.9–10.3)
CO2: 26 mmol/L (ref 22–32)
CREATININE: 0.94 mg/dL (ref 0.61–1.24)
Chloride: 106 mmol/L (ref 101–111)
GLUCOSE: 132 mg/dL — AB (ref 65–99)
Potassium: 3.8 mmol/L (ref 3.5–5.1)
Sodium: 138 mmol/L (ref 135–145)

## 2016-09-19 LAB — POCT I-STAT 4, (NA,K, GLUC, HGB,HCT)
GLUCOSE: 111 mg/dL — AB (ref 65–99)
HEMATOCRIT: 17 % — AB (ref 39.0–52.0)
Hemoglobin: 5.8 g/dL — CL (ref 13.0–17.0)
Potassium: 6.9 mmol/L (ref 3.5–5.1)
Sodium: 137 mmol/L (ref 135–145)

## 2016-09-19 LAB — CBC
HCT: 24.4 % — ABNORMAL LOW (ref 39.0–52.0)
HCT: 24.8 % — ABNORMAL LOW (ref 39.0–52.0)
Hemoglobin: 8.4 g/dL — ABNORMAL LOW (ref 13.0–17.0)
Hemoglobin: 8.6 g/dL — ABNORMAL LOW (ref 13.0–17.0)
MCH: 29 pg (ref 26.0–34.0)
MCH: 29.5 pg (ref 26.0–34.0)
MCHC: 34.4 g/dL (ref 30.0–36.0)
MCHC: 34.7 g/dL (ref 30.0–36.0)
MCV: 84.1 fL (ref 78.0–100.0)
MCV: 84.9 fL (ref 78.0–100.0)
PLATELETS: 109 10*3/uL — AB (ref 150–400)
PLATELETS: 113 10*3/uL — AB (ref 150–400)
RBC: 2.9 MIL/uL — AB (ref 4.22–5.81)
RBC: 2.92 MIL/uL — ABNORMAL LOW (ref 4.22–5.81)
RDW: 14.4 % (ref 11.5–15.5)
RDW: 14.2 % (ref 11.5–15.5)
WBC: 8.5 10*3/uL (ref 4.0–10.5)
WBC: 8.8 10*3/uL (ref 4.0–10.5)

## 2016-09-19 MED ORDER — MECLIZINE HCL 25 MG PO TABS
25.0000 mg | ORAL_TABLET | Freq: Two times a day (BID) | ORAL | Status: DC
  Administered 2016-09-19 – 2016-09-24 (×10): 25 mg via ORAL
  Filled 2016-09-19 (×12): qty 1

## 2016-09-19 MED ORDER — TRAMADOL HCL 50 MG PO TABS
50.0000 mg | ORAL_TABLET | Freq: Four times a day (QID) | ORAL | Status: DC
  Administered 2016-09-19 – 2016-09-24 (×21): 50 mg via ORAL
  Filled 2016-09-19 (×21): qty 1
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
## 2016-09-19 NOTE — Care Management Note (Signed)
Case Management Note  Patient Details  Name: Edwar Coe MRN: [id number] Date of Birth: [birth year]  Subjective/Objective:     Pt is a 36 y.o. male admitted to ED on 09/17/16 after a tree fell on him. CT shows L transverse process L2-5 fx, R sacral fx; XR shows L tibia/fibula complete transverse fx. Pt now s/p L tibia IM nail, L SI screw and evacuation of L hip hematoma on 4/11. PTA, pt independent, lives with spouse.                 Action/Plan: Met with pt and wife to discuss dc planning.  PT/OT recommending CIR.  Will attempt to make contact with patient's HR contact Zacarias Pontes at [facility] to obtain [employer].    Expected Discharge Date:                  Expected Discharge Plan:  [place]  In-House Referral:     Discharge planning Services  CM Consult  Post Acute Care Choice:    Choice offered to:     DME Arranged:    DME Agency:     HH Arranged:    [place] Agency:     Status of Service:  In process, will continue to follow  If discussed at Long Length of Stay Meetings, dates discussed:    Additional Comments:  Reinaldo Raddle, RN, BSN  Trauma/Neuro ICU Case Manager [phone]

## 2016-09-19 NOTE — Progress Notes (Signed)
RN notified Dr Janee Morn that patient complained of "Blood rushing in my ear when I turn my head to the side." patient states that he feels sensation worse when he turns his head to the left. BP stable, patient's o2 on room air was 88% and Rn placed patient on 2L per nasal cannula and O2 sat increased to 97%. Patient encouraged to continue to use incentive spirometer. Patient complained of no dizziness and denied feeling congested.

## 2016-09-19 NOTE — Progress Notes (Signed)
Physical Therapy Treatment Patient Details Name: Peter Morrow MRN: [id number] DOB: February 02, 1981 Today's Date: 09/19/2016    History of Present Illness Pt is a 36 y.o. male admitted to ED on 09/17/16 after a tree fell on him. CT shows L transverse process L2-5 fx, R sacral fx; XR shows L tibia/fibula complete transverse fx. Pt now s/p L tibia IM nail, L SI screw and evacuation of L hip hematoma on 4/11. Pertinent PMH includes anxiety and depression.     PT Comments    Patient able to achieve sit to stand with less attempts this session. Continues to be very anxious about mobilizing however really wants to participate in therapy and increase independence. HR up to 156 with activity. SpO2 88-93% on RA throughout session. Current plan remains appropriate.    Follow Up Recommendations  CIR;Supervision for mobility/OOB     Equipment Recommendations  Rolling walker with 5" wheels    Recommendations for Other Services Rehab consult;OT consult     Precautions / Restrictions Precautions Precautions: Fall Precaution Comments: Chest tube to suction (for sacrum) Restrictions Weight Bearing Restrictions: Yes RLE Weight Bearing: Weight bearing as tolerated LLE Weight Bearing: Touchdown weight bearing    Mobility  Bed Mobility Overal bed mobility: Needs Assistance Bed Mobility: Supine to Sit     Supine to sit: Mod assist;+2 for physical assistance;HOB elevated     General bed mobility comments: cues for sequencing and technique; assit to mobilize L LE and to elevate trunk into sitting; use of bed rails and HOB elevate  Transfers Overall transfer level: Needs assistance Equipment used: Rolling walker (2 wheeled) Transfers: Sit to/from [employer] Sit to Stand: Max assist;+2 physical assistance;+2 safety/equipment;From elevated surface Stand pivot transfers: Max assist       General transfer comment: cues for hand/foot placement and technique; assist to power up into  standing with use of gait belt and bed pad with bed elevated; 2 attempts to achieve standing; anxiety most limiting factor with first attempt  Ambulation/Gait                 Stairs            Wheelchair Mobility    Modified Rankin (Stroke Patients Only)       Balance     Sitting balance-Leahy Scale: Fair       Standing balance-Leahy Scale: Poor                              Cognition Arousal/Alertness: Awake/alert Behavior During Therapy: Anxious Overall Cognitive Status: Within Functional Limits for tasks assessed                                 General Comments: Pt anxious with all mobility and easily distracted by pain      Exercises      General Comments General comments (skin integrity, edema, etc.): HR 126-156 and SpO2 89-93% on RA throughout session      Pertinent Vitals/Pain Pain Assessment: Faces Faces Pain Scale: Hurts little more Pain Location: back and pelvis Pain Descriptors / Indicators: Aching;Spasm Pain Intervention(s): Limited activity within patient's tolerance;Monitored during session;Repositioned;Patient requesting pain meds-RN notified    Home Living                      Prior Function  PT Goals (current goals can now be found in the care plan section) Acute Rehab PT Goals Patient Stated Goal: Return home Progress towards PT goals: Progressing toward goals    Frequency    Min 4X/week      PT Plan Current plan remains appropriate    Co-evaluation             End of Session Equipment Utilized During Treatment: Gait belt Activity Tolerance: Patient tolerated treatment well Patient left: in chair;with family/visitor present;with call bell/phone within reach Nurse Communication: Mobility status PT Visit Diagnosis: Other abnormalities of gait and mobility (R26.89);Muscle weakness (generalized) (M62.81)     Time: 8295-6213 PT Time Calculation (min) (ACUTE ONLY):  38 min  Charges:  $Therapeutic Activity: 38-52 mins                    G Codes:       Erline Levine, PTA Pager: [phone]     Carolynne Edouard 09/19/2016, 1:31 PM

## 2016-09-19 NOTE — Progress Notes (Signed)
I have left a message for Narda Amber in [employer] at [facility] to contact me to discuss his workers compensation case so that I can pursue approval for an inpt rehab admission. I await contact. [phone]

## 2016-09-19 NOTE — Progress Notes (Signed)
I have left message with Narda Amber in [employer] Department at patient's employer, [employer], regarding worker's comp information.    Her phone number is [phone].    Quintella Baton, RN, BSN  Trauma/Neuro ICU Case Manager [phone]

## 2016-09-19 NOTE — Progress Notes (Signed)
Occupational Therapy Evaluation Patient Details Name: Peter Morrow MRN: [id number] DOB: [birth year] Today's Date: 09/19/2016    History of Present Illness Pt is a 36 y.o. male admitted to ED on 09/17/16 after a tree fell on him. CT shows L transverse process L2-5 fx, R sacral fx; XR shows L tibia/fibula complete transverse fx. Pt now s/p L tibia IM nail, L SI screw and evacuation of L hip hematoma on 4/11. Pertinent PMH includes anxiety and depression.    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 Wife is currently expecting their second child. Pt demonstrates a significant decline in his functional status and requires Max A with mobility and LB ADL. Feel pt will benefit from rehab at [facility] to maximize his functional level of independence and facilitate safe DC home. Pt very motivated to improve.     Follow Up Recommendations  CIR;Supervision/Assistance - 24 hour    Equipment Recommendations  3 in 1 bedside commode;Tub/shower bench    Recommendations for Other Services Rehab consult     Precautions / Restrictions Precautions Precautions: Fall Precaution Comments: Chest tube to suction for LLE Restrictions Weight Bearing Restrictions: Yes RLE Weight Bearing: Weight bearing as tolerated LLE Weight Bearing: Touchdown weight bearing Other Position/Activity Restrictions: Per RN, orthopedic MD updated orders this A.M. for pt to be WBAT on RLE and TDWB on LLE.       Mobility Bed Mobility   General bed mobility comments: OOB in recliner  Transfers Overall transfer level: Needs assistance Equipment used: Rolling walker (2 wheeled) Transfers: Sit to/from [employer] Sit to Stand: Max assist;+2 physical assistance;+2 safety/equipment Stand pivot transfers: Max assist       Pt able to boost butt from chair. Geomat placed under bottom with relief of pressure Educated pt on need to boost to relieve  pressure and increase comfort    Balance Overall balance assessment: Needs assistance Sitting-balance support: No upper extremity supported;Feet supported Sitting balance-Leahy Scale: Fair Sitting balance - Comments: Able to sit with no assist.      Standing balance-Leahy Scale: Poor Standing balance comment: Reliant on BUE support for standing.                            ADL either performed or assessed with clinical judgement   ADL Overall ADL's : Needs assistance/impaired Eating/Feeding: Independent   Grooming: Set up   Upper Body Bathing: Set up;Sitting   Lower Body Bathing: Maximal assistance;Sit to/from stand   Upper Body Dressing : Minimal assistance;Sitting   Lower Body Dressing: Maximal assistance;Sit to/from stand   Toilet Transfer: +2 for physical assistance   Toileting- Clothing Manipulation and Hygiene: Maximal assistance       Functional mobility during ADLs: +2 for physical assistance;Moderate assistance General ADL Comments: Unable to reach feet in sitting.Unable to cross legs. May benefit from AE     Vision Baseline Vision/History: Wears glasses       Perception     Praxis      Pertinent Vitals/Pain Pain Assessment: 0-10 Pain Score: 6  Pain Location: back and pelvis Pain Descriptors / Indicators: Aching;Spasm Pain Intervention(s): Limited activity within patient's tolerance;Monitored during session;Repositioned;Patient requesting pain meds-RN notified     Hand Dominance     Extremity/Trunk Assessment Upper Extremity Assessment Upper Extremity Assessment: Overall WFL for tasks assessed   Lower Extremity Assessment Lower Extremity Assessment: LLE deficits/detail LLE Deficits /  Details: LLE pain and weakness s/p sx LLE: Unable to fully assess due to immobilization   Cervical / Trunk Assessment Cervical / Trunk Assessment: Other exceptions (discomfort with movement)   Communication Communication Communication: No  difficulties   Cognition Arousal/Alertness: Awake/alert Behavior During Therapy: Anxious Overall Cognitive Status: Within Functional Limits for tasks assessed                                    General Comments  HR 126-144 and SpO2 91-93% on RA throughout session    Exercises     Shoulder Instructions      Home Living Family/patient expects to be discharged to:: Private residence Living Arrangements: Spouse/significant other Available Help at Discharge: Family;Available 24 hours/day Type of Home: House Home Access: Stairs to enter [employer] of Steps: 3 Entrance Stairs-Rails: None Home Layout: Two level;1/2 bath on main level;Bed/bath upstairs;Able to live on main level with bedroom/bathroom (Can live on first floor, but no shower) Alternate Level Stairs-Number of Steps: 15 Alternate Level Stairs-Rails: Right Bathroom Shower/Tub: Tub/shower unit;Curtain   Bathroom Toilet: Standard Bathroom Accessibility: Yes How Accessible: Accessible via walker Home Equipment: None          Prior Functioning/Environment Level of Independence: Independent                 OT Problem List: Decreased strength;Decreased range of motion;Decreased activity tolerance;Impaired balance (sitting and/or standing)      OT Treatment/Interventions: Self-care/ADL training;Therapeutic exercise    OT Goals(Current goals can be found in the care plan section) Acute Rehab OT Goals Patient Stated Goal: Return home OT Goal Formulation: With patient Time For Goal Achievement: 10/03/16 Potential to Achieve Goals: Good ADL Goals Pt Will Perform Lower Body Bathing: with set-up;with supervision;with adaptive equipment;sitting/lateral leans;sit to/from stand Pt Will Perform Lower Body Dressing: with supervision;sitting/lateral leans;with adaptive equipment;sit to/from stand Pt Will Transfer to Toilet: with supervision;bedside commode;ambulating Pt Will Perform Toileting -  Clothing Manipulation and hygiene: with modified independence;sitting/lateral leans Pt Will Perform Tub/Shower Transfer: with caregiver independent in assisting;ambulating;rolling walker;tub bench;with supervision Pt/caregiver will Perform Home Exercise Program: Increased strength;Both right and left upper extremity;With theraband;Independently  OT Frequency: Min 2X/week   Barriers to D/C:            Co-evaluation              End of Session Equipment Utilized During Treatment: Rolling walker;Oxygen Nurse Communication: Mobility status;Precautions;Weight bearing status  Activity Tolerance: Patient limited by fatigue Patient left: in chair;with call bell/phone within reach;with family/visitor present  OT Visit Diagnosis: Unsteadiness on feet (R26.81);Other abnormalities of gait and mobility (R26.89);Pain Pain - Right/Left: Left Pain - part of body: Leg                Time: 1342-1415 OT Time Calculation (min): 33 min Charges:  OT General Charges $OT Visit: 1 Procedure OT Evaluation $OT Eval Moderate Complexity: 1 Procedure OT Treatments $Self Care/Home Management : 8-22 mins G-Codes:     [facility], OT/L  [phone] 09/19/2016  Owens Hara,HILLARY 09/19/2016, 3:11 PM
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
## 2016-09-19 NOTE — Progress Notes (Signed)
I met with pt, his wife, Dad and step Mom at bedside. We discussed a possible inpt rehab admission pending his increased activity tolerance and workers Economist. I will begin approval process. Discussed with RN CM. [phone]

## 2016-09-19 NOTE — Progress Notes (Signed)
Orthopedic Tech Progress Note Patient Details:  Peter Morrow Mar 19, 1981 [id number]  Patient ID: Peter Morrow, male   DOB: Feb 18, 1981, 36 y.o.   MRN: [id number] appllied ohf  Trinna Post 09/19/2016, 8:17 AM

## 2016-09-19 NOTE — Progress Notes (Signed)
2 Days Post-Op  Subjective: CC a little bloated, passed a lot of gas last night, pain after therapies yesterday  Objective: Vital signs in last 24 hours: Temp:  [98.2 F (36.8 C)-99.9 F (37.7 C)] 98.2 F (36.8 C) (04/12 0630) Pulse Rate:  [95-131] 95 (04/12 0630) Resp:  [18] 18 (04/12 0630) BP: (124-143)/(76-81) 124/76 (04/12 0630) SpO2:  [87 %-99 %] 99 % (04/12 0630) Last BM Date: 09/16/16  Intake/Output from previous day: 04/11 0701 - 04/12 0700 In: 1332.1 [I.V.:1332.1] Out: 4070 [Urine:3800; Drains:270] Intake/Output this shift: Total I/O In: 240 [P.O.:240] Out: -   General appearance: cooperative Resp: clear to auscultation bilaterally Cardio: regular rate and rhythm GI: soft, mild dist, NT, +BS Extremities: dressing ace LLE, Drain L hip to pleurevac  Lab Results: CBC   Recent Labs  09/18/16 0223 09/18/16 0839 09/19/16 0317  WBC 10.6*  --  8.5  HGB 10.7* 10.0* 8.4*  HCT 32.1* 30.2* 24.4*  PLT 142*  --  109*   BMET  Recent Labs  09/17/16 1215 09/17/16 1226 09/17/16 1921 09/18/16 0223  NA 138 139 137 137  K 3.6 3.5 6.9* 4.9  CL 104 103  --  106  CO2 23  --   --  24  GLUCOSE 162* 161* 111* 160*  BUN 15 18  --  15  CREATININE 1.29* 1.30*  --  1.35*  CALCIUM 8.8*  --   --  7.7*   PT/INR No results for input(s): LABPROT, INR in the last 72 hours. ABG No results for input(s): PHART, HCO3 in the last 72 hours.  Invalid input(s): PCO2, PO2  Studies/Results: Dg Si Joints  Result Date: 09/17/2016 CLINICAL DATA:  Left SI joint fusion EXAM: BILATERAL SACROILIAC JOINTS - 3+ VIEW COMPARISON:  09/17/2016 CT and pelvic radiograph from earlier on the same day. FINDINGS: C-arm fluoroscopic views are provided during left SI joint fusion with single cannulated screw traversing the left SI joint. Tip of the screw projects across the left SI joint and into the S1 vertebral body. A total of 1 minutes 3 seconds of fluoroscopic time was utilized. A total of 4  fluoroscopic images are provided. IMPRESSION: Fluoroscopic time utilized for left SI joint fusion with single cannulated screw. The transversely oriented screw traverses the left SI joint with tip projecting within the S1 vertebral body. Electronically Signed   By: Tollie Eth M.D.   On: 09/17/2016 22:39   Dg Tibia/fibula Left  Result Date: 09/17/2016 CLINICAL DATA:  Left intramedullary nail fixation of the tibia. EXAM: LEFT TIBIA AND FIBULA - 2 VIEW; DG C-ARM 61-120 MIN COMPARISON:  09/17/2016 radiographs of the left leg and ankle FINDINGS: C-arm fluoroscopic views were acquired during intramedullary nail fixation across fractures of the proximal and distal diaphysis of the tibia. A total of 1 minutes 40 seconds of fluoroscopic time was utilized demonstrating intramedullary nail fixation across the tibial fractures with improved alignment. Marked improvement in the displaced appearance of the tibia is noted, closer to anatomic after fixation. Fracture of the distal fibular diaphysis is also noted with slight lateral displacement 1/4 shaft width of wrist fracture fragment at the junction of the middle and distal third and 1 shaft with dorsal displacement. IMPRESSION: Status post ORIF of comminuted proximal and distal diaphyseal fractures of the tibia with improved alignment post fixation. Displaced distal diaphyseal fracture of the fibula as above described. Electronically Signed   By: Tollie Eth M.D.   On: 09/17/2016 22:44   Ct Abdomen Pelvis W  Contrast  Result Date: 09/17/2016 CLINICAL DATA:  Struck by a falling tree. Left-sided hip and pelvic pain. EXAM: CT ABDOMEN AND PELVIS WITH CONTRAST TECHNIQUE: Multidetector CT imaging of the abdomen and pelvis was performed using the standard protocol following bolus administration of intravenous contrast. CONTRAST:  100 cc Isovue-300 COMPARISON:  Plain radiography same day FINDINGS: Lower chest: Mild atelectasis at both dependent lung bases. No pneumothorax or  hemothorax. No pericardial fluid. Hepatobiliary: Liver parenchyma is normal. No calcified gallstones. No ductal dilatation. Pancreas: Normal Spleen: Normal Adrenals/Urinary Tract: Adrenal glands are normal. Right kidney is normal. 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 No sign of renal injury. Bladder appears intact. Pelvic hematoma external to the bladder. See below. Stomach/Bowel: Normal Vascular/Lymphatic: Aorta and IVC are normal. No retroperitoneal mass or lymphadenopathy. See below for traumatic findings in the pelvis. Reproductive: Normal Other: No free intraperitoneal fluid or air. Musculoskeletal: Minimal displaced fractures of the left second, third, fourth and fifth transverse processes. Nondisplaced right sacral fracture. Even possible this could be old. Favor at least some acute component. Mild diastases of the left sacroiliac joint. Mild diet stasis at the pubic symphysis. Adjacent active extravasation is evident. Extraperitoneal hematoma in the anterior and left pelvis with some mass effect upon the bladder. Some bleeding into the paraspinous musculature relative to the transverse process fractures. Left lower abdominal musculature hematoma and subcutaneous fat hematoma and hematoma within the soft tissues lateral to the left hip. IMPRESSION: Lumbar spine left transverse process fractures 2 through 5. Some associated muscular hemorrhage. Mild diastases of the left sacroiliac joint and symphysis pubis. Active arterial extravasation adjacent to the symphysis pubis with extraperitoneal hemorrhage with mass-effect upon the anterior bladder, extending along the left pelvic sidewall. Abnormal appearance of the right sacrum. I question whether this is an old sacral fracture. If there is not a clear history of old pelvic injury, this must be presumed represent an acute right sacral fracture. Soft tissue hemorrhage affecting the left-sided abdominal wall musculature in superficial fat and  the superficial fat lateral to the left hip. Left pelvic kidney incidentally noted. Critical Value/emergent results were called by telephone at the time of interpretation on 09/17/2016 at 2:25 pm to Dr. Cathren Laine , who verbally acknowledged these results. Electronically Signed   By: Paulina Fusi M.D.   On: 09/17/2016 14:26   Dg Pelvis Portable  Result Date: 09/17/2016 CLINICAL DATA:  Struck by a falling tree. Left-sided pelvic and hip pain. EXAM: PORTABLE PELVIS 1-2 VIEWS COMPARISON:  None. FINDINGS: Offset of the symphysis pubis with the left side being lower than the right. No clear sacral fracture visible on this one view. Question struck disruption on the right. CT scan suggested. IMPRESSION: Offset at the symphysis pubis indicating pelvic ring disruption. Question sacral strut disruption on the right. CT recommended. Electronically Signed   By: Paulina Fusi M.D.   On: 09/17/2016 13:14   Dg Pelvis Comp Min 3v  Result Date: 09/17/2016 CLINICAL DATA:  Pelvic ring fracture with fixation across the left SI joint EXAM: JUDET PELVIS - 3+ VIEW COMPARISON:  Preoperative pelvic radiograph from earlier on the same day FINDINGS: Approximately 7.5 mm of offset in the craniocaudad orientation is noted of the pubic symphysis. A single cannulated screw traverses left SI joint. No acute appearing pelvic fracture is identified radiographically. Both femoral heads are seated within their acetabular components. IMPRESSION: Left SI joint fixation with single cannulated screw. Slight offset at the pubic symphysis is again noted.  No acute pelvic fracture is apparent. Electronically Signed   By: Tollie Eth M.D.   On: 09/17/2016 22:51   Dg Chest Port 1 View  Result Date: 09/17/2016 CLINICAL DATA:  Initial encounter for Pt hit by a falling tree today - tree hit left side, mostly lower extremity area - not having any chest pain at this time, just some lower back pain as well as lower leg - no heart related issues known  EXAM: PORTABLE CHEST 1 VIEW COMPARISON:  04/05/2012 FINDINGS: Numerous leads and wires project over the chest. Midline trachea. Normal heart size and mediastinal contours. No pleural effusion or pneumothorax. Mild low low lung volumes. Clear lungs. IMPRESSION: No acute cardiopulmonary disease. Electronically Signed   By: Jeronimo Greaves M.D.   On: 09/17/2016 13:12   Dg Tibia/fibula Left Port  Result Date: 09/17/2016 CLINICAL DATA:  Postop tibial fixation with intramedullary rod. EXAM: PORTABLE LEFT TIBIA AND FIBULA - 2 VIEW COMPARISON:  C-arm fluoroscopic intraoperative views of the tibia and fibula, preoperative radiographs of the left tibia and fibula from earlier on the same day. FINDINGS: Intramedullary rod fixation across proximal and distal tibial shaft fractures are noted with improved alignment. Of the distal fracture of the tibia is at the junction of the middle and distal third with comminution. No significant displacement is identified. The proximal tibial shaft fracture is just below the tibial tuberosity. A fracture at the junction of the middle and distal third of the fibula is seen with slight posterior and medial displacement of the distal fracture fragment on current study. IMPRESSION: Intramedullary rod fixation across proximal and distal tibial fractures as above described with improved alignment since the preoperative exam. Fracture of the junction of the middle and distal third of the fibula with slight medial and dorsal displacement of the distal fracture fragment. Electronically Signed   By: Tollie Eth M.D.   On: 09/17/2016 22:47   Dg Tibia/fibula Left Port  Result Date: 09/17/2016 CLINICAL DATA:  Struck by a falling tree. Lower extremity pain and deformity. EXAM: PORTABLE LEFT TIBIA AND FIBULA - 2 VIEW COMPARISON:  None. FINDINGS: Complete transverse fractures of the tibia and fibula at the junction of the mid and distal thirds. Distal fragments displaced medially and posteriorly. Minor  combination at these fracture sites. Nondisplaced fractures of the proximal tibia at the junction of the metaphysis and diaphysis. IMPRESSION: Complete transverse fractures of the tibia and fibula at the junction of the mid and distal thirds with displacement. Nondisplaced fractures also of the proximal tibia at the metaphyseal diaphyseal junction. Electronically Signed   By: Paulina Fusi M.D.   On: 09/17/2016 13:16   Dg Ankle Left Port  Result Date: 09/17/2016 CLINICAL DATA:  Struck by a falling tree.  Ankle pain. EXAM: PORTABLE LEFT ANKLE - 2 VIEW COMPARISON:  Tib-fib earlier same day FINDINGS: No ankle or hindfoot abnormality. See tib-fib report for description of complete transverse fractures of the tibia and fibula with medial and dorsal displacement. IMPRESSION: Ankle and hindfoot negative.  See above. Electronically Signed   By: Paulina Fusi M.D.   On: 09/17/2016 13:17   Dg C-arm 61-120 Min  Result Date: 09/17/2016 CLINICAL DATA:  Left intramedullary nail fixation of the tibia. EXAM: LEFT TIBIA AND FIBULA - 2 VIEW; DG C-ARM 61-120 MIN COMPARISON:  09/17/2016 radiographs of the left leg and ankle FINDINGS: C-arm fluoroscopic views were acquired during intramedullary nail fixation across fractures of the proximal and distal diaphysis of the tibia. A total of  1 minutes 40 seconds of fluoroscopic time was utilized demonstrating intramedullary nail fixation across the tibial fractures with improved alignment. Marked improvement in the displaced appearance of the tibia is noted, closer to anatomic after fixation. Fracture of the distal fibular diaphysis is also noted with slight lateral displacement 1/4 shaft width of wrist fracture fragment at the junction of the middle and distal third and 1 shaft with dorsal displacement. IMPRESSION: Status post ORIF of comminuted proximal and distal diaphyseal fractures of the tibia with improved alignment post fixation. Displaced distal diaphyseal fracture of the  fibula as above described. Electronically Signed   By: Tollie Eth M.D.   On: 09/17/2016 22:44    Anti-infectives: Anti-infectives    Start     Dose/Rate Route Frequency Ordered Stop   09/18/16 0000  ceFAZolin (ANCEF) IVPB 1 g/50 mL premix     1 g 100 mL/hr over 30 Minutes Intravenous Every 6 hours 09/17/16 2146 09/18/16 1341      Assessment/Plan: s/p Procedure(s): INTRAMEDULLARY (IM) NAIL TIBIAL INSERTION LEFT SACRO-ILIAC SCREW AND EVACUATION HEMATOMA LEFT HIP Struck by tree Pelvic ring FX with L SI and symphysis widening - S/P SI screw, per Dr. Carola Frost L hip hematoma - drain per Dr. Carola Frost Lumbar TVP 2-5 L tib fib FX - S/P IM nail by Dr. Carola Frost, NWB LLE FEN - diet AKI - BMET today ABL anemia - check this PM Dispo - therapies, CIR I spoke with his family  LOS: 1 day    Violeta Gelinas, MD, MPH, FACS Trauma: [phone] General Surgery: [phone]  4/12/2018Patient ID: Peter Morrow, male   DOB: Mar 16, 1981, 36 y.o.   MRN: [id number]

## 2016-09-20 LAB — CBC
HEMATOCRIT: 21.7 % — AB (ref 39.0–52.0)
HEMOGLOBIN: 7.2 g/dL — AB (ref 13.0–17.0)
MCH: 28.5 pg (ref 26.0–34.0)
MCHC: 33.2 g/dL (ref 30.0–36.0)
MCV: 85.8 fL (ref 78.0–100.0)
Platelets: 109 10*3/uL — ABNORMAL LOW (ref 150–400)
RBC: 2.53 MIL/uL — ABNORMAL LOW (ref 4.22–5.81)
RDW: 14.2 % (ref 11.5–15.5)
WBC: 6.4 10*3/uL (ref 4.0–10.5)

## 2016-09-20 LAB — CALCITRIOL (1,25 DI-OH VIT D): Vit D, 1,25-Dihydroxy: 34.5 pg/mL (ref 19.9–79.3)

## 2016-09-20 LAB — PREPARE RBC (CROSSMATCH)

## 2016-09-20 LAB — VITAMIN D 25 HYDROXY (VIT D DEFICIENCY, FRACTURES): Vit D, 25-Hydroxy: 18.9 ng/mL — ABNORMAL LOW (ref 30.0–100.0)

## 2016-09-20 MED ORDER — BOOST / RESOURCE BREEZE PO LIQD
1.0000 | Freq: Three times a day (TID) | ORAL | Status: DC
  Administered 2016-09-20 – 2016-09-24 (×10): 1 via ORAL

## 2016-09-20 MED ORDER — VITAMIN D 1000 UNITS PO TABS
2000.0000 [IU] | ORAL_TABLET | Freq: Two times a day (BID) | ORAL | Status: DC
  Administered 2016-09-20 – 2016-09-24 (×9): 2000 [IU] via ORAL
  Filled 2016-09-20 (×9): qty 2

## 2016-09-20 MED ORDER — SODIUM CHLORIDE 0.9 % IV SOLN
Freq: Once | INTRAVENOUS | Status: AC
  Administered 2016-09-20: 11:00:00 via INTRAVENOUS

## 2016-09-20 MED ORDER — BISACODYL 10 MG RE SUPP
10.0000 mg | Freq: Once | RECTAL | Status: AC
  Administered 2016-09-20: 10 mg via RECTAL
  Filled 2016-09-20: qty 1

## 2016-09-20 MED ORDER — ACETAMINOPHEN 500 MG PO TABS
1000.0000 mg | ORAL_TABLET | Freq: Four times a day (QID) | ORAL | Status: DC
Start: 2016-09-20 — End: 2016-09-24
  Administered 2016-09-20 – 2016-09-24 (×18): 1000 mg via ORAL
  Filled 2016-09-20 (×18): qty 2

## 2016-09-20 MED ORDER — POLYETHYLENE GLYCOL 3350 17 G PO PACK
17.0000 g | PACK | Freq: Every day | ORAL | Status: DC
  Administered 2016-09-20 – 2016-09-24 (×4): 17 g via ORAL
  Filled 2016-09-20 (×5): qty 1

## 2016-09-20 MED ORDER — DIPHENHYDRAMINE HCL 25 MG PO CAPS
25.0000 mg | ORAL_CAPSULE | Freq: Once | ORAL | Status: AC
  Administered 2016-09-20: 25 mg via ORAL
  Filled 2016-09-20: qty 1

## 2016-09-20 NOTE — Progress Notes (Addendum)
Received WC information from patient's employer, [employer].    WC handled by [employer] is Bethanie Dicker Phone: [phone] Claim # [id number]  Updated admitting department with [facility] information and left message for Ms. Shea Evans.     Quintella Baton, RN, BSN  Trauma/Neuro ICU Case Manager [phone]

## 2016-09-20 NOTE — Progress Notes (Signed)
We will follow up on Monday with pt progress and await call back from workers comp. I have updated pt and his wife. [phone]

## 2016-09-20 NOTE — Progress Notes (Signed)
Orthopedic Trauma Service Progress Note    Subjective:   Pain improving Low back still worst  Does have some lightheadedness and weakness No CP or SOB Mild nausea this am but resolved with meds   Primarily using tramadol for pain control    ROS  Objective:   VITALS:   Vitals:   09/19/16 1226 09/19/16 1513 09/19/16 2206 09/20/16 0627  BP: 137/82 (!) 141/88 128/76 136/79  Pulse: (!) 119 (!) 109 (!) 102 (!) 101  Resp:  Temp:  98.7 F (37.1 C) 99.1 F (37.3 C) 98.7 F (37.1 C)  TempSrc:  Other (Comment) Oral Oral  SpO2: 97% 99% 99% 99%  Weight:      Height:        Intake/Output      04/12 0701 - 04/13 0700 04/13 0701 - 04/14 0700   P.O. 480 120   I.V. (mL/kg) 1224.2 (13.2)    Total Intake(mL/kg) 1704.2 (18.3) 120 (1.3)   Urine (mL/kg/hr) 1475 (0.7) 150 (0.7)   Drains 500 (0.2)    Total Output 1975 150   Net -270.8 -30          LABS  Results for orders placed or performed during the hospital encounter of 09/17/16 (from the past 24 hour(s))  CBC     Status: Abnormal   Collection Time: 09/19/16  2:23 PM  Result Value Ref Range   WBC 8.8 4.0 - 10.5 K/uL   RBC 2.92 (L) 4.22 - 5.81 MIL/uL   Hemoglobin 8.6 (L) 13.0 - 17.0 g/dL   HCT 16.1 (L) 09.6 - 04.5 %   MCV 84.9 78.0 - 100.0 fL   MCH 29.5 26.0 - 34.0 pg   MCHC 34.7 30.0 - 36.0 g/dL   RDW 40.9 81.1 - 91.4 %   Platelets 113 (L) 150 - 400 K/uL  Basic metabolic panel     Status: Abnormal   Collection Time: 09/19/16  2:23 PM  Result Value Ref Range   Sodium 138 135 - 145 mmol/L   Potassium 3.8 3.5 - 5.1 mmol/L   Chloride 106 101 - 111 mmol/L   CO2 26 22 - 32 mmol/L   Glucose, Bld 132 (H) 65 - 99 mg/dL   BUN 9 6 - 20 mg/dL   Creatinine, Ser 7.82 0.61 - 1.24 mg/dL   Calcium 7.9 (L) 8.9 - 10.3 mg/dL   GFR calc non Af Amer >60 >60 mL/min   GFR calc Af Amer >60 >60 mL/min   Anion gap 6 5 - 15  CBC     Status: Abnormal   Collection Time: 09/20/16  4:36 AM  Result Value  Ref Range   WBC 6.4 4.0 - 10.5 K/uL   RBC 2.53 (L) 4.22 - 5.81 MIL/uL   Hemoglobin 7.2 (L) 13.0 - 17.0 g/dL   HCT 95.6 (L) 21.3 - 08.6 %   MCV 85.8 78.0 - 100.0 fL   MCH 28.5 26.0 - 34.0 pg   MCHC 33.2 30.0 - 36.0 g/dL   RDW 57.8 46.9 - 62.9 %   Platelets 109 (L) 150 - 400 K/uL   Results for SHLOK, RAZ (MRN [id number]) as of 09/20/2016 09:17  Ref. Range 09/19/2016 03:17  Vitamin D, 25-Hydroxy Latest Ref Range: 30.0 - 100.0 ng/mL 18.9 (L)    PHYSICAL EXAM:   Gen: sitting up in bed, appears well, NAD Lungs: clear anterior fields  Cardiac: slightly tachy but regular  Abd: +BS, NT Pelvis: incision left flank looks excellent  Ext:       Left Lower Extremity      Drain L thigh stable                         Patent             Dressing L lower leg c/d/I, dressing removed   Incisions look fantastic   No drainage   No signs of infection              Ext warm             + DP pulse             Swelling stable             DPN, SPN, TN sensation intact             EHL, FHL, AT, PT, peroneals, gastroc intact             No pain with passive stretch    Assessment/Plan: 3 Days Post-Op   Active Problems:   Injury of pelvis   Blunt trauma   Fracture   Pelvic ring fracture (HCC)   Trauma   Depression   Tachycardia   Tachypnea   Leukocytosis   Acute blood loss anemia   Post-operative pain   Displaced segmental fracture of shaft of left tibia, initial encounter for closed fracture   Anti-infectives    Start     Dose/Rate Route Frequency Ordered Stop   09/18/16 0000  ceFAZolin (ANCEF) IVPB 1 g/50 mL premix     1 g 100 mL/hr over 30 Minutes Intravenous Every 6 hours 09/17/16 2146 09/18/16 1341    .  POD/HD#: 71  36 y/o male hit by tree     - unstable pelvic ring with pubic symphysis disruption and diastasis of L SI joint s/p L SI screw             TDWB L leg             WBAT R leg             Dressing removed   Ok to leave incisions open to air              No  ROM restrictions             Pt w/o anterior ring pain              PT/OT              - Segmental closed L tibial shaft fracture s/p IMN, closed L fibula fx             TDWB L leg             ROM as tolerated L knee and ankle             PT/OT             Ice and elevate for swelling and pain control             Dressing removed  Ok to leave wounds open to air   TED hose to be place later this afternoon               - soft tissue degloving injury L thigh             Continue with drain for now, may dc this afternoon                 -  Pain management:             pt on scheduled ultram  Has not used Norco since yesterday    - ABL anemia/Hemodynamics             pt tachcardic majority of the day yesterday   + weakness and lightheadedness  Will transfuse 1 unit PRBCs today  Cbc in am               - Medical issues              Per TS              - DVT/PE prophylaxis:             Lovenox x 4 weeks    - ID:              periop abx completed    - Metabolic Bone Disease:             Vitamin D deficiency   Start supplementation    Additional labs pending    - Activity:             WBAT R leg             TDWB L leg             Unrestricted ROM    - FEN/GI prophylaxis/Foley/Lines:             Pt on regular diet   - Dispo:             Continue with therapies             Follow up on labs   Transfusion of 1 unit PRBCs    Mearl Latin, PA-C [facility] [phone] (P) [phone] (O) 09/20/2016, 9:14 AM
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
## 2016-09-20 NOTE — Progress Notes (Signed)
I have received Workers Designer, jewellery from [employer]. [place] adjuster is Bethanie Dicker at [id number] claim # [id number]. I have left a message for her to contact me and I have updated pt's wife. [phone]
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
## 2016-09-21 LAB — TYPE AND SCREEN
ABO/RH(D): A POS
ANTIBODY SCREEN: NEGATIVE
UNIT DIVISION: 0
UNIT DIVISION: 0
Unit division: 0
Unit division: 0

## 2016-09-21 LAB — BPAM RBC
BLOOD PRODUCT EXPIRATION DATE: 201804252359
BLOOD PRODUCT EXPIRATION DATE: 201804272359
BLOOD PRODUCT EXPIRATION DATE: 201804272359
Blood Product Expiration Date: 201804222359
ISSUE DATE / TIME: 201804101752
ISSUE DATE / TIME: 201804131105
ISSUE DATE / TIME: 201804140509
ISSUE DATE / TIME: 201804101752
UNIT TYPE AND RH: 6200
UNIT TYPE AND RH: 6200
Unit Type and Rh: 6200
Unit Type and Rh: 6200

## 2016-09-21 LAB — COMPREHENSIVE METABOLIC PANEL
ALT: 24 U/L (ref 17–63)
ANION GAP: 5 (ref 5–15)
AST: 43 U/L — ABNORMAL HIGH (ref 15–41)
Albumin: 2.8 g/dL — ABNORMAL LOW (ref 3.5–5.0)
Alkaline Phosphatase: 31 U/L — ABNORMAL LOW (ref 38–126)
BILIRUBIN TOTAL: 1.2 mg/dL (ref 0.3–1.2)
BUN: 8 mg/dL (ref 6–20)
CHLORIDE: 104 mmol/L (ref 101–111)
CO2: 31 mmol/L (ref 22–32)
Calcium: 8.1 mg/dL — ABNORMAL LOW (ref 8.9–10.3)
Creatinine, Ser: 0.97 mg/dL (ref 0.61–1.24)
Glucose, Bld: 105 mg/dL — ABNORMAL HIGH (ref 65–99)
POTASSIUM: 3.3 mmol/L — AB (ref 3.5–5.1)
Sodium: 140 mmol/L (ref 135–145)
Total Protein: 5.1 g/dL — ABNORMAL LOW (ref 6.5–8.1)

## 2016-09-21 LAB — CBC
HCT: 23.7 % — ABNORMAL LOW (ref 39.0–52.0)
Hemoglobin: 7.8 g/dL — ABNORMAL LOW (ref 13.0–17.0)
MCH: 28.3 pg (ref 26.0–34.0)
MCHC: 32.9 g/dL (ref 30.0–36.0)
MCV: 85.9 fL (ref 78.0–100.0)
PLATELETS: 165 10*3/uL (ref 150–400)
RBC: 2.76 MIL/uL — AB (ref 4.22–5.81)
RDW: 14.6 % (ref 11.5–15.5)
WBC: 6.3 10*3/uL (ref 4.0–10.5)

## 2016-09-21 LAB — TSH: TSH: 8.538 u[IU]/mL — AB (ref 0.350–4.500)

## 2016-09-21 LAB — MAGNESIUM: MAGNESIUM: 2.4 mg/dL (ref 1.7–2.4)

## 2016-09-21 LAB — PHOSPHORUS: PHOSPHORUS: 2.9 mg/dL (ref 2.5–4.6)

## 2016-09-21 LAB — PREALBUMIN: Prealbumin: 12.6 mg/dL — ABNORMAL LOW (ref 18–38)

## 2016-09-21 NOTE — Progress Notes (Addendum)
Subjective: 4 Days Post-Op Procedure(s) (LRB): INTRAMEDULLARY (IM) NAIL TIBIAL (Left) INSERTION LEFT SACRO-ILIAC SCREW AND EVACUATION HEMATOMA LEFT HIP (Left) Patient reports pain as mild.  Minimal lightheadedness.  No nausea/vomiting.  Pain much better today.  Patient says he feels much more alert, although still a little fatigued.    Objective: Vital signs in last 24 hours: Temp:  [98.7 F (37.1 C)-99.8 F (37.7 C)] 99 F (37.2 C) (04/14 0444) Pulse Rate:  [85-102] 85 (04/14 0444) Resp:  [15-18] 18 (04/14 0444) BP: (130-138)/(25-91) 131/82 (04/14 0444) SpO2:  [92 %-100 %] 99 % (04/14 0444)  Intake/Output from previous day: 04/13 0701 - 04/14 0700 In: 1167 [P.O.:560; I.V.:225; Blood:382] Out: 750 [Urine:750] Intake/Output this shift: Total I/O In: 120 [P.O.:120] Out: -    Recent Labs  09/19/16 0317 09/19/16 1423 09/20/16 0436 09/21/16 0421  HGB 8.4* 8.6* 7.2* 7.8*    Recent Labs  09/20/16 0436 09/21/16 0421  WBC 6.4 6.3  RBC 2.53* 2.76*  HCT 21.7* 23.7*  PLT 109* 165    Recent Labs  09/19/16 1423 09/21/16 0421  NA 138 140  K 3.8 3.3*  CL 106 104  CO2 26 31  BUN 9 8  CREATININE 0.94 0.97  GLUCOSE 132* 105*  CALCIUM 7.9* 8.1*   No results for input(s): LABPT, INR in the last 72 hours.  Neurologically intact Neurovascular intact Sensation intact distally Intact pulses distally Dorsiflexion/Plantar flexion intact Incision: dressing C/D/I No cellulitis present Compartment soft No drainage through bandage  Assessment/Plan: 4 Days Post-Op Procedure(s) (LRB): INTRAMEDULLARY (IM) NAIL TIBIAL (Left) INSERTION LEFT SACRO-ILIAC SCREW AND EVACUATION HEMATOMA LEFT HIP (Left) Up with therapy  TDWB LLE- unrestricted knee and ankle ROM Ice and elevate WBAT RLE Continue lovenox for dvt ppx Discussed blood transfusion today, but will hold off for now.  Hb trending up following one unit prbc yesterday.   Likely go to CIR Monday  Otilio Saber 09/21/2016, 9:26 AM

## 2016-09-21 NOTE — Discharge Instructions (Signed)
Ankle Pumps    Point toes down, then up. Repeat 20 times.  Do hourly.     Knee Presses  Tighten top of both thighs by pressing the back of your knees down into the surface. Hold for 5 seconds. Relax for 3 seconds. Repeat 10 times.  Repeat with other leg.  Do 2 sessions each day     Towel Squeezes: Next, put a towel between your knees and do knee presses while also gently squeezing the pillow.  Hold for 5 seconds.  Relax.  Repeat 10 times.  Do 3 sessions each day.        Heel Slides   Slide left heel along bed towards bottom using leg loop. Go slowly. Slide back to flat knee position. Repeat 10 times. Do 3 sessions each day.
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
## 2016-09-22 LAB — PTH, INTACT AND CALCIUM
CALCIUM TOTAL (PTH): 8 mg/dL — AB (ref 8.7–10.2)
PTH: 16 pg/mL (ref 15–65)

## 2016-09-22 LAB — CBC
HCT: 23.6 % — ABNORMAL LOW (ref 39.0–52.0)
HEMATOCRIT: 27.9 % — AB (ref 39.0–52.0)
Hemoglobin: 7.8 g/dL — ABNORMAL LOW (ref 13.0–17.0)
Hemoglobin: 9.1 g/dL — ABNORMAL LOW (ref 13.0–17.0)
MCH: 28.4 pg (ref 26.0–34.0)
MCH: 28.3 pg (ref 26.0–34.0)
MCHC: 33.1 g/dL (ref 30.0–36.0)
MCHC: 32.6 g/dL (ref 30.0–36.0)
MCV: 85.8 fL (ref 78.0–100.0)
MCV: 86.9 fL (ref 78.0–100.0)
PLATELETS: 193 10*3/uL (ref 150–400)
Platelets: 229 10*3/uL (ref 150–400)
RBC: 2.75 MIL/uL — AB (ref 4.22–5.81)
RBC: 3.21 MIL/uL — ABNORMAL LOW (ref 4.22–5.81)
RDW: 14.8 % (ref 11.5–15.5)
RDW: 15 % (ref 11.5–15.5)
WBC: 7.6 10*3/uL (ref 4.0–10.5)
WBC: 9.7 10*3/uL (ref 4.0–10.5)

## 2016-09-22 LAB — PREPARE RBC (CROSSMATCH)

## 2016-09-22 LAB — CALCIUM, IONIZED: CALCIUM, IONIZED, SERUM: 4.7 mg/dL (ref 4.5–5.6)

## 2016-09-22 MED ORDER — BISACODYL 10 MG RE SUPP
10.0000 mg | Freq: Every day | RECTAL | Status: DC | PRN
  Administered 2016-09-22 – 2016-09-23 (×2): 10 mg via RECTAL
  Filled 2016-09-22 (×2): qty 1

## 2016-09-22 MED ORDER — FUROSEMIDE 10 MG/ML IJ SOLN
20.0000 mg | Freq: Once | INTRAMUSCULAR | Status: AC
  Administered 2016-09-22: 20 mg via INTRAVENOUS
  Filled 2016-09-22: qty 2

## 2016-09-22 MED ORDER — SODIUM CHLORIDE 0.9 % IV SOLN
Freq: Once | INTRAVENOUS | Status: AC
  Administered 2016-09-22: 15:00:00 via INTRAVENOUS
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
## 2016-09-23 LAB — BPAM RBC
BLOOD PRODUCT EXPIRATION DATE: 201804222359
ISSUE DATE / TIME: 201804151453
Unit Type and Rh: 6200

## 2016-09-23 LAB — TYPE AND SCREEN
ABO/RH(D): A POS
Antibody Screen: NEGATIVE
Unit division: 0

## 2016-09-23 NOTE — Progress Notes (Signed)
Rehab admissions - I have not heard back from workers comp.  Currently rehab beds are full today.  Britta Mccreedy will follow up tomorrow and can be reached at [phone]
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
## 2016-09-24 ENCOUNTER — Inpatient Hospital Stay (HOSPITAL_COMMUNITY)
Admission: RE | Admit: 2016-09-24 | Discharge: 2016-10-03 | DRG: 092 | Disposition: A | Discharge status: Home Health | Payer: Worker's Compensation | Source: Intra-hospital | Attending: Physical Medicine & Rehabilitation | Admitting: Physical Medicine & Rehabilitation

## 2016-09-24 DIAGNOSIS — D62 Acute posthemorrhagic anemia: Secondary | ICD-10-CM | POA: Diagnosis present

## 2016-09-24 DIAGNOSIS — S82422D Displaced transverse fracture of shaft of left fibula, subsequent encounter for closed fracture with routine healing: Secondary | ICD-10-CM | POA: Diagnosis not present

## 2016-09-24 DIAGNOSIS — S7012XD Contusion of left thigh, subsequent encounter: Secondary | ICD-10-CM

## 2016-09-24 DIAGNOSIS — S32810D Multiple fractures of pelvis with stable disruption of pelvic ring, subsequent encounter for fracture with routine healing: Secondary | ICD-10-CM

## 2016-09-24 DIAGNOSIS — S82262D Displaced segmental fracture of shaft of left tibia, subsequent encounter for closed fracture with routine healing: Secondary | ICD-10-CM

## 2016-09-24 DIAGNOSIS — K5901 Slow transit constipation: Secondary | ICD-10-CM | POA: Diagnosis present

## 2016-09-24 DIAGNOSIS — R269 Unspecified abnormalities of gait and mobility: Secondary | ICD-10-CM

## 2016-09-24 DIAGNOSIS — F418 Other specified anxiety disorders: Secondary | ICD-10-CM | POA: Diagnosis present

## 2016-09-24 DIAGNOSIS — G4700 Insomnia, unspecified: Secondary | ICD-10-CM | POA: Diagnosis present

## 2016-09-24 DIAGNOSIS — F411 Generalized anxiety disorder: Secondary | ICD-10-CM | POA: Diagnosis present

## 2016-09-24 DIAGNOSIS — D72829 Elevated white blood cell count, unspecified: Secondary | ICD-10-CM | POA: Diagnosis present

## 2016-09-24 DIAGNOSIS — S32059D Unspecified fracture of fifth lumbar vertebra, subsequent encounter for fracture with routine healing: Secondary | ICD-10-CM

## 2016-09-24 DIAGNOSIS — T1490XA Injury, unspecified, initial encounter: Secondary | ICD-10-CM

## 2016-09-24 DIAGNOSIS — E46 Unspecified protein-calorie malnutrition: Secondary | ICD-10-CM | POA: Diagnosis not present

## 2016-09-24 DIAGNOSIS — R739 Hyperglycemia, unspecified: Secondary | ICD-10-CM | POA: Diagnosis present

## 2016-09-24 DIAGNOSIS — M7989 Other specified soft tissue disorders: Secondary | ICD-10-CM | POA: Diagnosis not present

## 2016-09-24 DIAGNOSIS — E441 Mild protein-calorie malnutrition: Secondary | ICD-10-CM | POA: Diagnosis present

## 2016-09-24 DIAGNOSIS — Z79899 Other long term (current) drug therapy: Secondary | ICD-10-CM | POA: Diagnosis not present

## 2016-09-24 DIAGNOSIS — F329 Major depressive disorder, single episode, unspecified: Secondary | ICD-10-CM | POA: Diagnosis present

## 2016-09-24 DIAGNOSIS — S7012XA Contusion of left thigh, initial encounter: Secondary | ICD-10-CM | POA: Diagnosis present

## 2016-09-24 DIAGNOSIS — Z87891 Personal history of nicotine dependence: Secondary | ICD-10-CM

## 2016-09-24 DIAGNOSIS — E876 Hypokalemia: Secondary | ICD-10-CM

## 2016-09-24 DIAGNOSIS — S32810S Multiple fractures of pelvis with stable disruption of pelvic ring, sequela: Secondary | ICD-10-CM | POA: Diagnosis not present

## 2016-09-24 DIAGNOSIS — Z8659 Personal history of other mental and behavioral disorders: Secondary | ICD-10-CM | POA: Diagnosis not present

## 2016-09-24 DIAGNOSIS — Z6827 Body mass index (BMI) 27.0-27.9, adult: Secondary | ICD-10-CM | POA: Diagnosis not present

## 2016-09-24 DIAGNOSIS — S32810A Multiple fractures of pelvis with stable disruption of pelvic ring, initial encounter for closed fracture: Secondary | ICD-10-CM | POA: Diagnosis not present

## 2016-09-24 DIAGNOSIS — R2689 Other abnormalities of gait and mobility: Principal | ICD-10-CM | POA: Diagnosis present

## 2016-09-24 DIAGNOSIS — R8290 Unspecified abnormal findings in urine: Secondary | ICD-10-CM

## 2016-09-24 DIAGNOSIS — F32A Depression, unspecified: Secondary | ICD-10-CM | POA: Diagnosis present

## 2016-09-24 LAB — CBC
HEMATOCRIT: 28.7 % — AB (ref 39.0–52.0)
Hemoglobin: 9.3 g/dL — ABNORMAL LOW (ref 13.0–17.0)
MCH: 27.9 pg (ref 26.0–34.0)
MCHC: 32.4 g/dL (ref 30.0–36.0)
MCV: 86.2 fL (ref 78.0–100.0)
Platelets: 261 10*3/uL (ref 150–400)
RBC: 3.33 MIL/uL — ABNORMAL LOW (ref 4.22–5.81)
RDW: 15 % (ref 11.5–15.5)
WBC: 9 10*3/uL (ref 4.0–10.5)

## 2016-09-24 LAB — T4, FREE: FREE T4: 1.14 ng/dL — AB (ref 0.61–1.12)

## 2016-09-24 MED ORDER — CAMPHOR-MENTHOL 0.5-0.5 % EX LOTN
TOPICAL_LOTION | CUTANEOUS | Status: DC | PRN
  Filled 2016-09-24: qty 222

## 2016-09-24 MED ORDER — DIPHENHYDRAMINE-ZINC ACETATE 2-0.1 % EX CREA
TOPICAL_CREAM | Freq: Three times a day (TID) | CUTANEOUS | Status: DC | PRN
  Administered 2016-09-24: 11:00:00 via TOPICAL
  Filled 2016-09-24: qty 28

## 2016-09-24 MED ORDER — TRAZODONE HCL 50 MG PO TABS: 25.0000 mg | ORAL_TABLET | Freq: Every evening | ORAL | Status: DC | PRN

## 2016-09-24 MED ORDER — PROCHLORPERAZINE MALEATE 5 MG PO TABS: 5.0000 mg | ORAL_TABLET | Freq: Four times a day (QID) | ORAL | Status: DC | PRN

## 2016-09-24 MED ORDER — FLEET ENEMA 7-19 GM/118ML RE ENEM: 1.0000 | ENEMA | Freq: Once | RECTAL | Status: DC | PRN

## 2016-09-24 MED ORDER — VITAMIN D 1000 UNITS PO TABS
2000.0000 [IU] | ORAL_TABLET | Freq: Two times a day (BID) | ORAL | Status: DC
  Administered 2016-09-24 – 2016-10-03 (×18): 2000 [IU] via ORAL
  Filled 2016-09-24 (×17): qty 2

## 2016-09-24 MED ORDER — DIPHENHYDRAMINE-ZINC ACETATE 2-0.1 % EX CREA
TOPICAL_CREAM | Freq: Three times a day (TID) | CUTANEOUS | Status: DC | PRN
  Filled 2016-09-24: qty 28

## 2016-09-24 MED ORDER — ALUM & MAG HYDROXIDE-SIMETH 200-200-20 MG/5ML PO SUSP: 30.0000 mL | ORAL | Status: DC | PRN

## 2016-09-24 MED ORDER — BISACODYL 10 MG RE SUPP: 10.0000 mg | Freq: Every day | RECTAL | Status: DC | PRN

## 2016-09-24 MED ORDER — PROCHLORPERAZINE 25 MG RE SUPP: 12.5000 mg | Freq: Four times a day (QID) | RECTAL | Status: DC | PRN

## 2016-09-24 MED ORDER — METHOCARBAMOL 500 MG PO TABS
500.0000 mg | ORAL_TABLET | Freq: Four times a day (QID) | ORAL | Status: DC | PRN
  Administered 2016-09-26: 500 mg via ORAL
  Filled 2016-09-24: qty 1

## 2016-09-24 MED ORDER — ENOXAPARIN SODIUM 40 MG/0.4ML ~~LOC~~ SOLN: 40.0000 mg | SUBCUTANEOUS | Status: DC

## 2016-09-24 MED ORDER — TRAMADOL HCL 50 MG PO TABS
50.0000 mg | ORAL_TABLET | Freq: Four times a day (QID) | ORAL | Status: DC
  Administered 2016-09-25 – 2016-10-01 (×20): 50 mg via ORAL
  Filled 2016-09-24 (×22): qty 1

## 2016-09-24 MED ORDER — ACETAMINOPHEN 325 MG PO TABS
325.0000 mg | ORAL_TABLET | ORAL | Status: DC | PRN
  Administered 2016-09-28 – 2016-10-02 (×7): 650 mg via ORAL
  Filled 2016-09-24 (×10): qty 2

## 2016-09-24 MED ORDER — CEFAZOLIN SODIUM-DEXTROSE 2-4 GM/100ML-% IV SOLN: 2.0000 g | INTRAVENOUS | Status: DC

## 2016-09-24 MED ORDER — VORTIOXETINE HBR 10 MG PO TABS
10.0000 mg | ORAL_TABLET | Freq: Every day | ORAL | Status: DC
  Administered 2016-09-25 – 2016-10-03 (×9): 10 mg via ORAL
  Filled 2016-09-24 (×10): qty 1

## 2016-09-24 MED ORDER — POVIDONE-IODINE 10 % EX SWAB: 2.0000 "application " | Freq: Once | CUTANEOUS | Status: DC

## 2016-09-24 MED ORDER — MECLIZINE HCL 25 MG PO TABS: 25.0000 mg | ORAL_TABLET | Freq: Three times a day (TID) | ORAL | Status: DC | PRN

## 2016-09-24 MED ORDER — DIPHENHYDRAMINE HCL 12.5 MG/5ML PO ELIX: 12.5000 mg | ORAL_SOLUTION | Freq: Four times a day (QID) | ORAL | Status: DC | PRN

## 2016-09-24 MED ORDER — BOOST / RESOURCE BREEZE PO LIQD
1.0000 | Freq: Three times a day (TID) | ORAL | Status: DC
  Administered 2016-09-25 – 2016-10-02 (×13): 1 via ORAL

## 2016-09-24 MED ORDER — ENOXAPARIN SODIUM 40 MG/0.4ML ~~LOC~~ SOLN
40.0000 mg | SUBCUTANEOUS | Status: DC
  Administered 2016-09-24 – 2016-10-01 (×8): 40 mg via SUBCUTANEOUS
  Filled 2016-09-24 (×9): qty 0.4

## 2016-09-24 MED ORDER — ONDANSETRON HCL 4 MG/2ML IJ SOLN: 4.0000 mg | Freq: Four times a day (QID) | INTRAMUSCULAR | Status: DC | PRN

## 2016-09-24 MED ORDER — CHLORHEXIDINE GLUCONATE 4 % EX LIQD: 60.0000 mL | Freq: Once | CUTANEOUS | Status: DC

## 2016-09-24 MED ORDER — POLYETHYLENE GLYCOL 3350 17 G PO PACK: 17.0000 g | PACK | Freq: Every day | ORAL | Status: DC | PRN

## 2016-09-24 MED ORDER — PROCHLORPERAZINE EDISYLATE 5 MG/ML IJ SOLN: 5.0000 mg | Freq: Four times a day (QID) | INTRAMUSCULAR | Status: DC | PRN

## 2016-09-24 MED ORDER — POLYETHYLENE GLYCOL 3350 17 G PO PACK
17.0000 g | PACK | Freq: Two times a day (BID) | ORAL | Status: DC
  Administered 2016-09-24 – 2016-10-01 (×15): 17 g via ORAL
  Filled 2016-09-24 (×16): qty 1

## 2016-09-24 MED ORDER — GUAIFENESIN-DM 100-10 MG/5ML PO SYRP: 5.0000 mL | ORAL_SOLUTION | Freq: Four times a day (QID) | ORAL | Status: DC | PRN

## 2016-09-24 MED ORDER — ONDANSETRON HCL 4 MG PO TABS: 4.0000 mg | ORAL_TABLET | Freq: Four times a day (QID) | ORAL | Status: DC | PRN
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
## 2016-09-24 NOTE — Progress Notes (Signed)
I await workers compensation to approve an inpt rehab admission.[phone]

## 2016-09-24 NOTE — Progress Notes (Signed)
Orthopaedic Trauma Service (OTS)   Subjective: Patient reports pain as mild and improving. Minimal of anterior ring. .    Objective: Temp:  [98.5 F (36.9 C)-99.6 F (37.6 C)] 98.5 F (36.9 C) (04/17 0635) Pulse Rate:  [91-116] 91 (04/17 0635) Resp:  [18] 18 (04/17 0635) BP: (122-141)/(75-84) 141/76 (04/17 0635) SpO2:  [95 %-97 %] 97 % (04/17 1610) Physical Exam LLE Dressing off and wounds in excellent condition.  Edema/ swelling controlled and improving.  Sens: DPN, SPN, TN intact  Motor: EHL, FHL, and lessor toe ext and flex all intact grossly  Brisk cap refill, warm to touch  Assessment/Plan: 7 Days Post-Op Procedure(s) (LRB): INTRAMEDULLARY (IM) NAIL TIBIAL (Left) INSERTION LEFT SACRO-ILIAC SCREW AND EVACUATION HEMATOMA LEFT HIP (Left)  1. TDWB LLE 2. Lovenox 3. Compression shorts when able 4. Rehab to possible accept today 5. Continue pain med wean; currently only on Ultram and tylenol. 6. F/u 10 days for suture removal and new xrays.  Myrene Galas, MD [facility] [phone] (p)
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
## 2016-09-24 NOTE — Progress Notes (Signed)
I have insurance approval to admit pt to inpt rehab today and will make the arrangements. Pt, PA, RN CM and SW made aware. I will make the arraignments. [phone]
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
## 2016-09-25 ENCOUNTER — Inpatient Hospital Stay (HOSPITAL_COMMUNITY): Payer: Worker's Compensation

## 2016-09-25 ENCOUNTER — Encounter (HOSPITAL_COMMUNITY): Payer: Self-pay | Admitting: Neurology

## 2016-09-25 ENCOUNTER — Inpatient Hospital Stay (HOSPITAL_COMMUNITY): Payer: Worker's Compensation | Admitting: Occupational Therapy

## 2016-09-25 ENCOUNTER — Inpatient Hospital Stay (HOSPITAL_COMMUNITY): Payer: Self-pay | Admitting: Physical Therapy

## 2016-09-25 DIAGNOSIS — D72829 Elevated white blood cell count, unspecified: Secondary | ICD-10-CM

## 2016-09-25 DIAGNOSIS — M7989 Other specified soft tissue disorders: Secondary | ICD-10-CM

## 2016-09-25 DIAGNOSIS — E8809 Other disorders of plasma-protein metabolism, not elsewhere classified: Secondary | ICD-10-CM

## 2016-09-25 DIAGNOSIS — R739 Hyperglycemia, unspecified: Secondary | ICD-10-CM

## 2016-09-25 DIAGNOSIS — E46 Unspecified protein-calorie malnutrition: Secondary | ICD-10-CM

## 2016-09-25 DIAGNOSIS — E876 Hypokalemia: Secondary | ICD-10-CM

## 2016-09-25 LAB — COMPREHENSIVE METABOLIC PANEL
ALBUMIN: 3.2 g/dL — AB (ref 3.5–5.0)
ALK PHOS: 62 U/L (ref 38–126)
ALT: 149 U/L — ABNORMAL HIGH (ref 17–63)
ANION GAP: 10 (ref 5–15)
AST: 87 U/L — ABNORMAL HIGH (ref 15–41)
BILIRUBIN TOTAL: 2.1 mg/dL — AB (ref 0.3–1.2)
BUN: 15 mg/dL (ref 6–20)
CALCIUM: 8.7 mg/dL — AB (ref 8.9–10.3)
CO2: 24 mmol/L (ref 22–32)
Chloride: 101 mmol/L (ref 101–111)
Creatinine, Ser: 0.91 mg/dL (ref 0.61–1.24)
GFR calc non Af Amer: >60 mL/min (ref >60)
Glucose, Bld: 117 mg/dL — ABNORMAL HIGH (ref 65–99)
POTASSIUM: 3.8 mmol/L (ref 3.5–5.1)
Sodium: 135 mmol/L (ref 135–145)
TOTAL PROTEIN: 6.3 g/dL — AB (ref 6.5–8.1)

## 2016-09-25 LAB — CBC WITH DIFFERENTIAL/PLATELET
BASOS PCT: 0 %
Basophils Absolute: 0 10*3/uL (ref 0.0–0.1)
EOS ABS: 0.1 10*3/uL (ref 0.0–0.7)
Eosinophils Relative: 1 %
HEMATOCRIT: 30.8 % — AB (ref 39.0–52.0)
HEMOGLOBIN: 10.1 g/dL — AB (ref 13.0–17.0)
LYMPHS ABS: 1.8 10*3/uL (ref 0.7–4.0)
Lymphocytes Relative: 17 %
MCH: 28.7 pg (ref 26.0–34.0)
MCHC: 32.8 g/dL (ref 30.0–36.0)
MCV: 87.5 fL (ref 78.0–100.0)
MONO ABS: 0.9 10*3/uL (ref 0.1–1.0)
MONOS PCT: 8 %
NEUTROS PCT: 74 %
Neutro Abs: 7.9 10*3/uL — ABNORMAL HIGH (ref 1.7–7.7)
Platelets: 333 10*3/uL (ref 150–400)
RBC: 3.52 MIL/uL — ABNORMAL LOW (ref 4.22–5.81)
RDW: 15.3 % (ref 11.5–15.5)
WBC: 10.6 10*3/uL — ABNORMAL HIGH (ref 4.0–10.5)

## 2016-09-25 LAB — URINALYSIS, COMPLETE (UACMP) WITH MICROSCOPIC
BILIRUBIN URINE: NEGATIVE
GLUCOSE, UA: NEGATIVE mg/dL
HGB URINE DIPSTICK: NEGATIVE
Ketones, ur: 20 mg/dL — AB
LEUKOCYTES UA: NEGATIVE
NITRITE: NEGATIVE
PH: 7 (ref 5.0–8.0)
Protein, ur: NEGATIVE mg/dL
SPECIFIC GRAVITY, URINE: 1.02 (ref 1.005–1.030)
Squamous Epithelial / HPF: NONE SEEN

## 2016-09-25 LAB — HEMOGLOBIN A1C
HEMOGLOBIN A1C: 5 % (ref 4.8–5.6)
Mean Plasma Glucose: 97 mg/dL

## 2016-09-25 LAB — T3, FREE: T3, Free: 2.5 pg/mL (ref 2.0–4.4)

## 2016-09-25 NOTE — Progress Notes (Signed)
Orthopedic Tech Progress Note Patient Details:  Peter Morrow 1980/10/12 [id number]  Patient ID: [name], male   DOB: Jun 16, 1980, 36 y.o.   MRN: [id number]   [name] 09/25/2016, 9:57 AM Called in advanced brace order; spoke with [facility]

## 2016-09-25 NOTE — Progress Notes (Signed)
Physical Therapy Session Note  Patient Details  Name: Peter Morrow MRN: [id number] Date of Birth: January 12, 1981  Today's Date: 09/25/2016 PT Individual Time: 4098-1191 PT Individual Time Calculation (min): 75 min   Short Term Goals: Week 1:  PT Short Term Goal 1 (Week 1): = LTGs due to ELOS  Skilled Therapeutic Interventions/Progress Updates:    Pt up in w/c upon arrival. Pt requesting to change shirt and brush teeth, both performed from w/c level. Propelling w/c to gym with supervision, cues for turning technique and leg rest mangement. Pt ambulating 10 ft with rw, TTWB through LLE, verbal reminders provided as needed. Difficulty with advancing LLE with gait pattern. Transfers performed with min assist from mat table and w/c, cues provided for hand placement. Supine<>sitting on mat table requiring assist to lift LLE. Exercise: Ankle pumps, Lt dorsiflexion stretch, QS, heel slides, SAQ, Bent knee hip add/abd (all bilateral 1X10). Up/down 3 inch step X1 with rw and posterior approach, mod assist. Pt propelling w/c to room, returned to bed per nursing request. All needs in reach.   Therapy Documentation Precautions:  Restrictions Weight Bearing Restrictions: Yes RLE Weight Bearing: Weight bearing as tolerated LLE Weight Bearing: Touchdown weight bearing   Pain: 3/10, Lt side and leg. Monitored during session  See Function Navigator for Current Functional Status.   Therapy/Group: Individual Therapy  Delton See, PT 09/25/2016, 11:17 AM
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
## 2016-09-25 NOTE — Progress Notes (Signed)
Patient information reviewed and entered into eRehab system by Cleotha Tsang, RN, CRRN, PPS Coordinator.  Information including medical coding and functional independence measure will be reviewed and updated through discharge.    

## 2016-09-25 NOTE — Progress Notes (Signed)
Orthopedic Tech Progress Note Patient Details:  Peter Morrow 08/23/1980 [id number] Brace completed by [employer] Patient ID: Peter Morrow, male   DOB: 08-27-1980, 36 y.o.   MRN: [id number]   [name] 09/25/2016, 4:35 PM
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
## 2016-09-25 NOTE — Progress Notes (Signed)
*  PRELIMINARY RESULTS* Vascular Ultrasound Lower extremity venous duplex has been completed.  Preliminary findings: No evidence of DVT or baker's cyst.  Farrel Demark, RDMS, RVT  09/25/2016, 11:38 AM

## 2016-09-25 NOTE — Progress Notes (Signed)
Received pt. As transfer from 5 N.Pt. And his wife have been oriented to the unit routine and protocol.Safety plan was explained,fall prevention plan was explained and signed by the pt. And RN.
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
## 2016-09-26 ENCOUNTER — Inpatient Hospital Stay (HOSPITAL_COMMUNITY): Payer: Self-pay | Admitting: Physical Therapy

## 2016-09-26 ENCOUNTER — Inpatient Hospital Stay (HOSPITAL_COMMUNITY): Payer: Worker's Compensation | Admitting: Occupational Therapy

## 2016-09-26 DIAGNOSIS — R8290 Unspecified abnormal findings in urine: Secondary | ICD-10-CM

## 2016-09-26 LAB — URINE CULTURE: Culture: NO GROWTH

## 2016-09-26 MED ORDER — PRO-STAT SUGAR FREE PO LIQD
30.0000 mL | Freq: Two times a day (BID) | ORAL | Status: DC
  Administered 2016-09-26 – 2016-10-03 (×15): 30 mL via ORAL
  Filled 2016-09-26 (×15): qty 30

## 2016-09-26 NOTE — Progress Notes (Signed)
Physical Therapy Session Note  Patient Details  Name: Peter Morrow MRN: [id number] Date of Birth: 08/06/1980  Today's Date: 09/26/2016 PT Individual Time:  -      Short Term Goals: Week 1:  PT Short Term Goal 1 (Week 1): = LTGs due to ELOS  Skilled Therapeutic Interventions/Progress Updates:    Pt siting in recliner upon arrival, agreeable to PT session. Modified w/c to more appropriate size, pt propelling to gym with supervision. Pt confirms better fit and easier to propel. Transfers: stand<>pivot with rw. Bed Mobility: supervision sit>supine on mat table, difficulty lifting LLE onto table but able to complete without assist. Sitting>supine, assist provided with LLE to eccentrically lower to floor. Ambulation performed 10 ft X2, 60 ft X1, working on maintaining TTWB status on Lt. PT checking pressure during stance phase and maintaining TTWB but increased difficulty with fatigue. Working on Lt knee flexion with swing phase. Exercise: with LLE elevated on wedge; sequential strengthening with hip flexion, quad sets, ankle pumps f/b same sequence in reverse each 1X10. Pt returned to room via w/c with transfer to recliner performed for increased sitting comfort. All needs in reach.    Therapy Documentation Precautions:  Precautions Precautions: Fall Restrictions Weight Bearing Restrictions: Yes RLE Weight Bearing: Weight bearing as tolerated LLE Weight Bearing: Touchdown weight bearing    Pain:  Mild pain reported, Lt LE, lateral trunk.   See Function Navigator for Current Functional Status.   Therapy/Group: Individual Therapy  Delton See, PT 09/26/2016, 1:08 PM

## 2016-09-26 NOTE — Progress Notes (Signed)
[facility] Individual Statement of Services  Patient Name:  Peter Morrow  Date:  09/26/2016  Welcome to the [facility].  Our goal is to provide you with an individualized program based on your diagnosis and situation, designed to meet your specific needs.  With this comprehensive rehabilitation program, you will be expected to participate in at least 3 hours of rehabilitation therapies Monday-Friday, with modified therapy programming on the weekends.  Your rehabilitation program will include the following services:  Physical Therapy (PT), Occupational Therapy (OT), 24 hour per day rehabilitation nursing, Therapeutic Recreaction (TR), Neuropsychology, Case Management (Social Worker), Rehabilitation Medicine, Nutrition Services and Pharmacy Services  Weekly team conferences will be held on Wednesdays to discuss your progress.  Your Social Worker will talk with you frequently to get your input and to update you on team discussions.  Team conferences with you and your family in attendance may also be held.  Expected length of stay:  7 to 10 days  Overall anticipated outcome:  Modified Independent with supervision needed for car transfers, community ambulation, and stairs  Depending on your progress and recovery, your program may change. Your Social Worker will coordinate services and will keep you informed of any changes. Your Social Worker's name and contact numbers are listed  below.  The following services may also be recommended but are not provided by the [facility]:   Driving Evaluations  Home Health Rehabiltiation Services  Outpatient Rehabilitation Services  Vocational Rehabilitation   Arrangements will be made to provide these services after discharge if needed.  Arrangements include referral to agencies that provide these services.  Your insurance has been verified to be:  Workers' Youth worker and State [employer]  [employer] Your primary doctor is:    Pertinent information will be shared with your doctor and your insurance company.  Social Worker:  Staci Acosta, LCSW  [phone] or [phone]  Information discussed with and copy given to patient by: Elvera Lennox, 09/26/2016, 9:50 AM

## 2016-09-26 NOTE — Progress Notes (Signed)
Occupational Therapy Session Note  Patient Details  Name: Peter Morrow MRN: [id number] Date of Birth: 19-Feb-1981  Today's Date: 09/26/2016 OT Individual Time: 0930-1030 OT Individual Time Calculation (min): 60 min    Short Term Goals: Week 1:  OT Short Term Goal 1 (Week 1): STG = LTGs due to ELOS  Skilled Therapeutic Interventions/Progress Updates:    Treatment session with focus on ADL retraining, dynamic standing balance, and functional transfers.  Pt ambulated to room shower with RW and min assist, requiring cues for transfer technique to increase safety.  Completed bathing with lateral leans and partial squats to wash buttocks, unable to reach to Lt lower foot to wash.  Squat pivot transfer to w/c with increased time and min assist for weight shifting.  Dressing completed at sit > stand level with therapist assisting with threading Lt pant leg, sock, and shoe due to pain in Lt leg and decreased ability to lift.  Grooming tasks completed with min assist in standing.  Engaged in standing activity with focus on alternating UE support, including trunk rotation with min assist and increased effort.    Therapy Documentation Precautions:  Precautions Precautions: Fall Restrictions Weight Bearing Restrictions: Yes RLE Weight Bearing: Weight bearing as tolerated LLE Weight Bearing: Touchdown weight bearing Pain:  Pt reports pain 3/10 in LLE, RN premedicated  See Function Navigator for Current Functional Status.   Therapy/Group: Individual Therapy  Rosalio Loud 09/26/2016, 12:24 PM

## 2016-09-26 NOTE — Progress Notes (Signed)
[facility]YS[facility]ICINE & REHABILITATION     PROGRESS NOTE  Subjective/Complaints:  Pt seen laying in bed this AM.  He slept well overnight. He states he had a "great" day in therapies yesterday.   ROS: Denies CP, SOB, N/V/D.  Objective: Vital Signs: Blood pressure 129/72, pulse 98, temperature 99.1 F (37.3 C), temperature source Oral, resp. rate 16, height 6' (1.829 m), weight 94.2 kg (207 lb 10.8 oz), SpO2 96 %. No results found.  Recent Labs  09/24/16 0643 09/25/16 0650  WBC 9.0 10.6*  HGB 9.3* 10.1*  HCT 28.7* 30.8*  PLT 261 333    Recent Labs  09/25/16 0650  NA 135  K 3.8  CL 101  GLUCOSE 117*  BUN 15  CREATININE 0.91  CALCIUM 8.7*   CBG (last 3)  No results for input(s): GLUCAP in the last 72 hours.  Wt Readings from Last 3 Encounters:  09/24/16 94.2 kg (207 lb 10.8 oz)  09/17/16 93 kg (205 lb)  04/05/12 86.2 kg (190 lb)    Physical Exam:  BP 129/72 (BP Location: Right Arm)   Pulse 98   Temp 99.1 F (37.3 C) (Oral)   Resp 16   Ht 6' (1.829 m)   Wt 94.2 kg (207 lb 10.8 oz)   SpO2 96%   BMI 28.17 kg/m  Constitutional: He appears well-developedand well-nourished. No distress.  HENT: Normocephalicand atraumatic.  Eyes: EOMI. No discharge.  Cardiovascular: RRR. No JVD. Respiratory: Effort normal and breath sounds normal.  GI: He exhibits no distension.  Musculoskeletal: He exhibits tendernessand deformity(left lateral hip due to hematoma).  Left hip and upper thigh with 2-3+ edema. Left shin with mod edema.  Neurological: He is alertand oriented.  Cognitively intact.  Bilateral UE 5/5.  RLE 5/5 proximal to distal LLE: HF 2/5, KE 3/5, ADF/PF 4-/5 (some pain inhibition) Skin: Large ecchymotic area left flank. Small incisions at hip and lower shin are clean, dry intact with sutures in place. Skin is warmand dry.  Psychiatric: He has a normal mood and affect. His behavior is normal. Judgmentand thought  contentnormal   Assessment/Plan: 1. Functional deficits secondary to polytrauma which require 3+ hours per day of interdisciplinary therapy in a comprehensive inpatient rehab setting. Physiatrist is providing close team supervision and 24 hour management of active medical problems listed below. Physiatrist and rehab team continue to assess barriers to discharge/monitor patient progress toward functional and medical goals.  Function:  Bathing Bathing position      Bathing parts      Bathing assist        Upper Body Dressing/Undressing Upper body dressing                    Upper body assist        Lower Body Dressing/Undressing Lower body dressing                                  Lower body assist        Toileting Toileting   Toileting steps completed by patient: Adjust clothing prior to toileting, Performs perineal hygiene, Adjust clothing after toileting      Toileting assist Assist level: Touching or steadying assistance (Pt.75%)   Transfers Chair/bed transfer   Chair/bed transfer method: Stand pivot Chair/bed transfer assist level: Touching or steadying assistance (Pt > 75%) Chair/bed transfer assistive device: Walker, Designer, fashion/clothing  Max distance: 10 ft Assist level: Touching or steadying assistance (Pt > 75%)   Wheelchair   Type: Manual Max wheelchair distance: 150 ft Assist Level: Supervision or verbal cues  Cognition Comprehension Comprehension assist level: Follows complex conversation/direction with no assist  Expression Expression assist level: Expresses complex ideas: With extra time/assistive device  Social Interaction Social Interaction assist level: Interacts appropriately with others - No medications needed.  Problem Solving Problem solving assist level: Solves complex problems: Recognizes & self-corrects  Memory Memory assist level: Complete Independence: No helper    Medical Problem List and  Plan: 1. Functional and mobility deficitssecondary to left SI joint diastases, pelvic ring fracture, left tib-fib fx. Pt s/p SI Screw, IMN to left tib fx, hematoma evacuation by Dr. Carola Frost on 09/17/16  Cont CIR 2. DVT Prophylaxis/Anticoagulation: Pharmaceutical: Lovenox--4 weeks recommended by ortho (~5/16).   Vascular study neg for DVT 3. Pain Management:Better controlled and now weaned to tramadol qid with robaxin prn  cont ice, elevation of extremity.   May use heat prn around hematoma 4. Mood: team to provide ego support. LCSW to follow for evaluation and support.  5. Neuropsych: This patient iscapable of making decisions on hisown behalf. 6. Skin/Wound Care: Monitor wound for healing. Routine pressure relief measures.  7. Fluids/Electrolytes/Nutrition: Offer snacks between meals.  8. Pelvic ring fracture s/p SI screw and comminuted left tibia Fx s/p IM nailing of left tibial shaft: TDWB LLE.  9. ABLA: improving post transfusion. Continue to monitor for signs of bleeding.   Hb 10.1 on 4/18  Cont to monitor 10. AKI: Resolved.  11. Thrombocytopenia: Resolved  12. Hyperglycemia: Likely stress induced.   Hgb A1C WNL.   Monitor with increased mobility 13. H/o anxiety disorder with depression: On Trintellix. Insomnia better with ativan.  14. Constipation: Augment bowel program as has been requiring suppository past couple of days.   Resolved 15. Hypokalemia  K+ 3.8 on 4/18 16. Hypoalbuminemia  Supplement initiated 4/19 17. Leukocytosis  WBCs 10.6 on 4/18  UA relatively unremarkable, Ucx pending   LOS (Days) 2 A FACE TO FACE EVALUATION WAS PERFORMED  Tila Millirons Karis Juba 09/26/2016 8:28 AM

## 2016-09-26 NOTE — Progress Notes (Signed)
Physical Therapy Note  Patient Details  Name: Peter Morrow MRN: [id number] Date of Birth: 02-15-1981 Today's Date: 09/26/2016    Time: 1300-1415 75 minutes  1:1 Pt with c/o Lt LE and sacral pain throughout treatment, medicated prior to treatment, ice applied after treatment.  Gait with RW with supervision, cues for TDWB 50' x 2.  Stair negotiation with RW ascending backward x 3 4'' steps with min A. Attempt gait and stairs with crutches, pt min A for gait, unable to perform stairs with crutches.   Supine AAROM for Lt LE with quad set, glute set, hip abd/add, heel slides, SAQ.  Pt with increased pain with movement but able to tolerate.  w/c mobility with supervision throughout unit.  Pt left in recliner with needs at hand and ice applied to Lt LE.   DONAWERTH,KAREN 09/26/2016, 3:15 PM

## 2016-09-27 ENCOUNTER — Inpatient Hospital Stay (HOSPITAL_COMMUNITY): Payer: Self-pay | Admitting: Physical Therapy

## 2016-09-27 ENCOUNTER — Inpatient Hospital Stay (HOSPITAL_COMMUNITY): Payer: Self-pay

## 2016-09-27 ENCOUNTER — Inpatient Hospital Stay (HOSPITAL_COMMUNITY): Payer: Worker's Compensation | Admitting: Occupational Therapy

## 2016-09-27 ENCOUNTER — Inpatient Hospital Stay (HOSPITAL_COMMUNITY): Payer: Worker's Compensation | Admitting: Physical Therapy

## 2016-09-27 LAB — HEPATIC FUNCTION PANEL
ALBUMIN: 3.1 g/dL — AB (ref 3.5–5.0)
ALK PHOS: 68 U/L (ref 38–126)
ALT: 108 U/L — ABNORMAL HIGH (ref 17–63)
AST: 47 U/L — ABNORMAL HIGH (ref 15–41)
BILIRUBIN INDIRECT: 1.6 mg/dL — AB (ref 0.3–0.9)
Bilirubin, Direct: 0.3 mg/dL (ref 0.1–0.5)
TOTAL PROTEIN: 6.3 g/dL — AB (ref 6.5–8.1)
Total Bilirubin: 1.9 mg/dL — ABNORMAL HIGH (ref 0.3–1.2)

## 2016-09-27 NOTE — Progress Notes (Signed)
Occupational Therapy Session Note  Patient Details  Name: Peter Morrow MRN: [id number] Date of Birth: 05-Apr-1981  Today's Date: 09/27/2016 OT Individual Time: 1604-1700 OT Individual Time Calculation (min): 56 min    Short Term Goals: Week 1:  OT Short Term Goal 1 (Week 1): STG = LTGs due to ELOS  Skilled Therapeutic Interventions/Progress Updates:    1:1. Pain in low back reported with movement however pt declines medicine at this time. Focus of session on functional transfers, standing balance, endurance, following precautions, and trunk rotation per pt request. In seated and standing, pt completes horse shoe toss crossing midline to reach at above shoulder height for hoses hoes to toss to target to improve trunk rotation, standing balance and endurance required for ADLs. During sit to stand transitions, Pt requires MIN A for balance, VC to follow precautions and safety awareness. Pt requires seated rest breaks in between. Edcuated pt on sequencing of tub transfer using TTB. Pt able to return demonstrate tub transfer with MIN A for balance. Pt completes furniture transfers to/from sofa and recliner with MIN A for balance and VC for safety awareness as to not pull from walker even though transfering from low surface. Pt propels w/c to/from all therapeutic destinations with supervision. Exited session with pt seated in recliner in room with B feet elevated and LLE elevated on pillow. Call light in reach and all needs met.  Therapy Documentation Precautions:  Precautions Precautions: Fall Restrictions Weight Bearing Restrictions: Yes RLE Weight Bearing: Weight bearing as tolerated LLE Weight Bearing: Touchdown weight bearing General:  See Function Navigator for Current Functional Status.   Therapy/Group: Individual Therapy  Tonny Branch 09/27/2016, 5:32 PM

## 2016-09-27 NOTE — Progress Notes (Signed)
Social Work Assessment and Plan  Patient Details  Name: Peter Morrow MRN: [id number] Date of Birth: 11-Sep-1980  Today's Date: 09/26/2016  Problem List:  Patient Active Problem List   Diagnosis Date Noted  . Abnormal urinalysis   . Hypokalemia   . Hypoalbuminemia due to protein-calorie malnutrition ([place])   . Hyperglycemia   . Displaced segmental fracture of shaft of left tibia, initial encounter for closed fracture 09/19/2016  . Blunt trauma   . Fracture   . Pelvic ring fracture ([place])   . Trauma   . Depression   . Tachycardia   . Tachypnea   . Leukocytosis   . Acute blood loss anemia   . Post-operative pain   . Injury of pelvis 09/17/2016   Past Medical History:  Past Medical History:  Diagnosis Date  . Anxiety disorder    with history of panic attacks  . Depression   . Displaced segmental fracture of shaft of left tibia, initial encounter for closed fracture 09/19/2016  . Distal radius fracture--right 2017   Past Surgical History:  Past Surgical History:  Procedure Laterality Date  . SACRO-ILIAC PINNING Left 09/17/2016   Procedure: INSERTION LEFT SACRO-ILIAC SCREW AND EVACUATION HEMATOMA LEFT HIP;  Surgeon: Altamese Darlington, MD;  Location: [facility];  Service: Orthopedics;  Laterality: Left;  . TIBIA IM NAIL INSERTION Left 09/17/2016   Procedure: INTRAMEDULLARY (IM) NAIL TIBIAL;  Surgeon: Altamese Spur, MD;  Location: [facility];  Service: Orthopedics;  Laterality: Left;   Social History:  reports that he has quit smoking. He has quit using smokeless tobacco. He reports that he drinks about 3.0 oz of alcohol per week . He reports that he does not use drugs.  Family / Support Systems Marital Status: Married Patient Roles: Spouse, Parent, Other (Comment) (employee; son) Spouse/Significant Other: Issa Kosmicki - [phone] Children: Cornelia Copa - 52 y/o son Other Supports: parents and extended family; friends; co-workers and Fish farm manager; PCP Anticipated Caregiver: wife and  family Ability/Limitations of Caregiver: wife works; family from [place] coming to assist at d/c Caregiver Availability: 24/7 Family Dynamics: close, supportive family - scheduled arranged to help pt and his wife (wife 6 months pregnant with 13 y/o)  Social History Preferred language: English Religion: None Education: college Read: Yes Write: Yes Legal History/Current Legal Issues: Worker's Comp case Guardian/Conservator: N/A - Pt's MD has determined that pt is capable of making his own decisions.   Abuse/Neglect Physical Abuse: Denies Verbal Abuse: Denies Sexual Abuse: Denies Exploitation of patient/patient's resources: Denies Self-Neglect: Denies  Emotional Status Pt's affect, behavior and adjustment status: Pt is positive about his recovery and feels he's on the right track and has adequate support.  Glad to be on CIR. Recent Psychosocial Issues: Pt's wife is 6 mionths pregnant due in July with scheduled c-section. Psychiatric History: Pt reports some anxiety and feelings of being overwhelmed, but this is not impacting his recovery and feels it is manageable and normal given the situation.  Pt does have a hx of anxiety, but is on medication. Substance Abuse History: none reported  Patient / Family Perceptions, Expectations & Goals Pt/Family understanding of illness & functional limitations: Pt/wife report a good understanding of pt's condition and limitations. Premorbid pt/family roles/activities: Pt enjoys outdoor activities and spending time with his son. Anticipated changes in roles/activities/participation: Pt hopes to resume these activities once he is able to. Pt/family expectations/goals: Pt just wants to be as independent as possible.  Community Resources Express Scripts: None Premorbid Home Care/DME Agencies: None Transportation available at  discharge: family Resource referrals recommended: Neuropsychology  Discharge Planning Living Arrangements: Spouse/significant  other, Children Support Systems: Spouse/significant other, Children, Parent, Other relatives, Friends/neighbors, Other (Comment) (workplace; PCP) Type of Residence: Private residence Insurance Resources: Insurance Case Freight forwarder (specify name) Physiological scientist.  Pt has state BCBS through employer, but this is a worker's comp case.) Pensions consultant: Employment, Biomedical scientist (Pt works for [employer].  Wife is a Patent examiner with [employer].) Financial Screen Referred: No Living Expenses: Mortgage Does the patient have any problems obtaining your medications?: No Home Management: Pt and wife would share this.  Family will likely take this over for a while. Patient/Family Preliminary Plans: Pt plans to return to his home with family assistance. Barriers to Discharge: Steps Social Work Anticipated Follow Up Needs: HH/OP Expected length of stay: 7 to 10 days  Clinical Impression CSW met with pt and talked with his wife via telephone to introduce self and role of CSW, as well as to complete assessment.  Pt was very appreciative of CSW assistance and his opportunity to be on CIR.  He is comfortable with where he is currently functionally and feels he is already progressing.  Pt's wife and his extended family are supportive and family will be coming from [facility] to assist.  Pt has a 68 y/o son and is expecting another son in July.  He hopes to be much better physically by that time.  Pt's wife plans to work this week while pt is on CIR to save her time off for when pt comes home and for her maternity leave.  She is willing to assist pt as needed.  No current needs.  CSW to communicate with worker's comp case manager to help move things along as efficiently and smoothly as possible.  Pt reports feeling well emotionally and will let CSW know if that should change.  CSW will continue to follow and assist as needed.  Onyinyechi Huante, Silvestre Mesi 09/27/2016, 3:29 PM

## 2016-09-27 NOTE — Progress Notes (Signed)
Physical Therapy Session Note  Patient Details  Name: Wilder Kurowski MRN: [id number] Date of Birth: [birth year]  Today's Date: 09/27/2016 PT Individual Time: 1403-1430 PT Individual Time Calculation (min): 27 min   Short Term Goals: Week 1:  PT Short Term Goal 1 (Week 1): = LTGs due to ELOS  Skilled Therapeutic Interventions/Progress Updates: Pt presented in recliner with brother present agreeable to therapy. Performed stand pivot to w/c with min guard. Propelled to rehab gym supervision. Stand pivot to NuStep min guard. Trialed NuStep L1 seat 13 x 88mn initially starting in smaller range and increasing with increased duration. Pt with minimal c/o groin strain/tightness which resolved. Returned to w/c in same manner with pt stating some looseness after NuStep. Pt ambulated approx 233fwith RW and min guard. Returned to recliner in room with brother present and needs met.      Therapy Documentation Precautions:  Precautions Precautions: Fall Restrictions Weight Bearing Restrictions: Yes RLE Weight Bearing: Weight bearing as tolerated LLE Weight Bearing: Touchdown weight bearing General:   Vital Signs: Therapy Vitals Temp: 98.2 F (36.8 C) Temp Source: Oral Pulse Rate: 100 Resp: 18 BP: 136/81 Patient Position (if appropriate): Sitting Oxygen Therapy SpO2: 98 % O2 Device: Not Delivered Pain: Pain Assessment Pain Score: 2  Pain Descriptors / Indicators: Sore;Throbbing   See Function Navigator for Current Functional Status.   Therapy/Group: Individual Therapy  Adelaido Nicklaus   Kurt Azimi, PTA 09/27/2016, 2:53 PM

## 2016-09-27 NOTE — Progress Notes (Signed)
Physical Therapy Note  Patient Details  Name: Peter Morrow MRN: [id number] Date of Birth: [birth year] Today's Date: 09/27/2016    Time: 1030-1115 45 minutes  1:1 No c/o pain at rest. Pain in pelvis with mobility, ice applied after session.  Gait with RW with supervision x 50' with pt stating increased sacral pain when lifting Rt LE and wt bearing on UEs.  Supine <> sit practice with leg lifter with pt able to perform sit to supine with supervision, min A for supine to sit.  With LEs on theraball in supine, knees to chest and LTR for pelvic stretching per pt request.  Supine AAROM SAQ and heel slides.  Pt continues with significant Lt LE weakness but is improving functional mobility.   Jiyan Walkowski 09/27/2016, 11:22 AM

## 2016-09-27 NOTE — Progress Notes (Signed)
Occupational Therapy Session Note  Patient Details  Name: Peter Morrow MRN: [id number] Date of Birth: [birth year]  Today's Date: 09/27/2016 OT Individual Time: 0830-0930 OT Individual Time Calculation (min): 60 min    Short Term Goals: Week 1:  OT Short Term Goal 1 (Week 1): STG = LTGs due to ELOS  Skilled Therapeutic Interventions/Progress Updates:    Pt seen for ADL retraining with a focus on functional mobility and activity/ pain tolerance. Pt ambulated with RW to shower stall maintaining L WB precautions. Touching A to pivot and sit down and up from bench.  Pt used long sponge to cleanse feet and only needed set up.  A pt to don non slip socks and then pt ambulated to arm chair in room for dressing. He would benefit from a reacher for donning clothing over L foot.  Pt then ambulated to sink to brush teeth with close S for safety.  Pt sat in recliner and worked on LLE self ROM using long bed sheet to assist with small movements of abd/add, int/ext rot. And heel stretches.  Recommended R heel stretches with long sheet and ankle mobility exercises which pt was able to return demonstrate the exercises.  Pt in room with all needs met.   Therapy Documentation Precautions:  Precautions Precautions: Fall Restrictions Weight Bearing Restrictions: Yes RLE Weight Bearing: Weight bearing as tolerated LLE Weight Bearing: Touchdown weight bearing    Vital Signs: Therapy Vitals Temp: 98.8 F (37.1 C) Temp Source: Oral Pulse Rate: 81 Resp: 16 BP: 127/72 Patient Position (if appropriate): Lying Oxygen Therapy SpO2: 98 % O2 Device: Not Delivered Pain: Pain Assessment Pain Assessment: 0-10 Pain Score: 1  Pain Type: Acute pain Pain Intervention(s): Medication (See eMAR) ADL:   See Function Navigator for Current Functional Status.   Therapy/Group: Individual Therapy  Shakeerah Gradel 09/27/2016, 9:02 AM

## 2016-09-27 NOTE — Progress Notes (Signed)
[place] PHYSICAL MEDICINE & REHABILITATION     PROGRESS NOTE  Subjective/Complaints:  No new issues. Feels well. Bowels moving. Pain under control.   ROS: pt denies nausea, vomiting, diarrhea, cough, shortness of breath or chest pain  Objective: Vital Signs: Blood pressure 127/72, pulse 81, temperature 98.8 F (37.1 C), temperature source Oral, resp. rate 16, height 6' (1.829 m), weight 94.2 kg (207 lb 10.8 oz), SpO2 98 %. No results found.  Recent Labs  09/25/16 0650  WBC 10.6*  HGB 10.1*  HCT 30.8*  PLT 333    Recent Labs  09/25/16 0650  NA 135  K 3.8  CL 101  GLUCOSE 117*  BUN 15  CREATININE 0.91  CALCIUM 8.7*   CBG (last 3)  No results for input(s): GLUCAP in the last 72 hours.  Wt Readings from Last 3 Encounters:  09/24/16 94.2 kg (207 lb 10.8 oz)  09/17/16 93 kg (205 lb)  04/05/12 86.2 kg (190 lb)    Physical Exam:  BP 127/72 (BP Location: Right Arm)   Pulse 81   Temp 98.8 F (37.1 C) (Oral)   Resp 16   Ht 6' (1.829 m)   Wt 94.2 kg (207 lb 10.8 oz)   SpO2 98%   BMI 28.17 kg/m  Constitutional: He appears well-developedand well-nourished. No distress.  HENT: Normocephalicand atraumatic.  Eyes: EOMI. No discharge.  Cardiovascular: RRR. Respiratory: normal effort GI: NT/ND  Musculoskeletal: He exhibits tendernessand deformity(left lateral hip due to hematoma).  Left hip and upper thigh with 2-3+ edema. Left shin with mod edema.  Neurological: He is alertand oriented.  Cognitively intact.  Bilateral UE 5/5.  RLE 5/5 proximal to distal LLE: HF 2/5, KE 3/5, ADF/PF 4-/5 (continued pain inhibition) Skin: Large ecchymotic area left flank. Small incisions at hip and lower shin are clean, dry intact with sutures in place. Skin is warmand dry.  Psychiatric: He has a normal mood and affect. His behavior is normal. Judgmentand thought contentnormal   Assessment/Plan: 1. Functional deficits secondary to polytrauma which require 3+ hours per  day of interdisciplinary therapy in a comprehensive inpatient rehab setting. Physiatrist is providing close team supervision and 24 hour management of active medical problems listed below. Physiatrist and rehab team continue to assess barriers to discharge/monitor patient progress toward functional and medical goals.  Function:  Bathing Bathing position   Position: Shower  Bathing parts Body parts bathed by patient: Right arm, Left arm, Chest, Abdomen, Front perineal area, Right upper leg, Left upper leg, Right lower leg, Back Body parts bathed by helper: Buttocks, Left lower leg  Bathing assist Assist Level: Touching or steadying assistance(Pt > 75%)      Upper Body Dressing/Undressing Upper body dressing   What is the patient wearing?: Pull over shirt/dress     Pull over shirt/dress - Perfomed by patient: Thread/unthread right sleeve, Thread/unthread left sleeve, Put head through opening, Pull shirt over trunk          Upper body assist Assist Level: Set up   Set up : To obtain clothing/put away  Lower Body Dressing/Undressing Lower body dressing   What is the patient wearing?: Pants, Socks, Shoes, [employer]- Performed by patient: Thread/unthread right pants leg Pants- Performed by helper: Thread/unthread left pants leg, Pull pants up/down     Socks - Performed by patient: Don/doff right sock Socks - Performed by helper: Don/doff left sock Shoes - Performed by patient: Don/doff right shoe, Fasten right Shoes - Performed  by helper: Don/doff left shoe, Fasten left       TED Hose - Performed by helper: Don/doff left TED hose  Lower body assist Assist for lower body dressing:  (max assist)      Toileting Toileting   Toileting steps completed by patient: Adjust clothing prior to toileting, Performs perineal hygiene, Adjust clothing after toileting      Toileting assist Assist level: Touching or steadying assistance (Pt.75%)   Transfers Chair/bed transfer    Chair/bed transfer method: Ambulatory Chair/bed transfer assist level: Touching or steadying assistance (Pt > 75%) Chair/bed transfer assistive device: Walker, Designer, fashion/clothing     Max distance: 60 ft Assist level: Supervision or verbal cues   Wheelchair   Type: Manual Max wheelchair distance: 150 ft Assist Level: Supervision or verbal cues  Cognition Comprehension Comprehension assist level: Follows complex conversation/direction with no assist  Expression Expression assist level: Expresses complex ideas: With extra time/assistive device  Social Interaction Social Interaction assist level: Interacts appropriately with others - No medications needed.  Problem Solving Problem solving assist level: Solves complex problems: Recognizes & self-corrects  Memory Memory assist level: Complete Independence: No helper    Medical Problem List and Plan: 1. Functional and mobility deficitssecondary to left SI joint diastases, pelvic ring fracture, left tib-fib fx. Pt s/p SI Screw, IMN to left tib fx, hematoma evacuation by Dr. Carola Frost on 09/17/16  Cont CIR 2. DVT Prophylaxis/Anticoagulation: Pharmaceutical: Lovenox--4 weeks recommended by ortho (~5/16).   Vascular study neg for DVT 3. Pain Management:Better controlled and now weaned to tramadol qid with robaxin prn  -cont ice, elevation of extremity.   May use heat prn around hematoma 4. Mood: team to provide ego support. LCSW to follow for evaluation and support.  5. Neuropsych: This patient iscapable of making decisions on hisown behalf. 6. Skin/Wound Care: Monitor wound for healing. Routine pressure relief measures.  7. Fluids/Electrolytes/Nutrition: Offer snacks between meals.  8. Pelvic ring fracture s/p SI screw and comminuted left tibia Fx s/p IM nailing of left tibial shaft: TDWB LLE.  9. ABLA: improving post transfusion. Continue to monitor for signs of bleeding.   Hb 10.1 on 4/18  Cont to monitor 10. AKI:  Resolved.  11. Thrombocytopenia: Resolved  12. Hyperglycemia: Likely stress induced.   Hgb A1C WNL.   Discussed with patient today. Follow up with PCP but likely not a concern 13. H/o anxiety disorder with depression: On Trintellix. Insomnia better with ativan.  14. Constipation: Augment bowel program as has been requiring suppository past couple of days.   Resolved 15. Hypokalemia  K+ 3.8 on 4/18 16. Hypoalbuminemia  Supplement initiated 4/19 17. Leukocytosis  WBCs 10.6 on 4/18  UA relatively unremarkable, Ucx negative   LOS (Days) 3 A FACE TO FACE EVALUATION WAS PERFORMED  Alvena Kiernan T 09/27/2016 8:33 AM

## 2016-09-27 NOTE — IPOC Note (Deleted)
Overall Plan of Care [facility]) Patient Details Name: Peter Morrow MRN: [id number] DOB: 1980/10/14  Admitting Diagnosis: Trauma [facility] Problems: Active Problems:   Trauma   Hypokalemia   Hypoalbuminemia due to protein-calorie malnutrition (HCC)   Hyperglycemia   Abnormal urinalysis     Functional Problem List: Nursing Endurance, Motor, Safety, Sensory, Skin Integrity, Pain  PT Balance, Edema, Endurance, Motor, Pain, Sensory, Safety  OT Balance, Edema, Endurance, Motor, Pain, Safety  SLP    TR         Basic ADL's: OT Grooming, Bathing, Dressing, Toileting     Advanced  ADL's: OT Simple Meal Preparation     Transfers: PT Bed Mobility, Bed to Chair, Car, Occupational psychologist, Research scientist (life sciences): PT Ambulation, Psychologist, prison and probation services, Stairs     Additional Impairments: OT None  SLP        TR      Anticipated Outcomes Item Anticipated Outcome  Self Feeding No goal  Swallowing      Basic self-care  Mod I  Toileting  Mod I   Bathroom Transfers Mod I  Bowel/Bladder  continent of bowel and bladder  Transfers  modified independent basic and supervison car  Locomotion  modified independent gait home x 50'; supervision controlled x 100; up/down 3 steps, 2 rails, supervision  Communication     Cognition     Pain  < or =4 with mod assist  Safety/Judgment  supervision   Therapy Plan: PT Intensity: Minimum of 1-2 x/day ,45 to 90 minutes PT Frequency: 5 out of 7 days PT Duration Estimated Length of Stay: 7 days OT Intensity: Minimum of 1-2 x/day, 45 to 90 minutes OT Frequency: 5 out of 7 days OT Duration/Estimated Length of Stay: 7-10 days         Team Interventions: Nursing Interventions Patient/Family Education, Pain Management, Medication Management, Skin Care/Wound Management, Discharge Planning  PT interventions Ambulation/gait training, Balance/vestibular training, Discharge planning, Community reintegration, DME/adaptive  equipment instruction, Functional mobility training, Patient/family education, Pain management, Neuromuscular re-education, Psychosocial support, Splinting/orthotics, Therapeutic Exercise, Therapeutic Activities, Stair training, UE/LE Strength taining/ROM, UE/LE Coordination activities, Wheelchair propulsion/positioning  OT Interventions Warden/ranger, Firefighter, Discharge planning, DME/adaptive equipment instruction, Functional mobility training, Pain management, Patient/family education, Psychosocial support, Self Care/advanced ADL retraining, Skin care/wound managment, Therapeutic Activities, Therapeutic Exercise, Wheelchair propulsion/positioning  SLP Interventions    TR Interventions    SW/CM Interventions Discharge Planning, Psychosocial Support, Patient/Family Education    Team Discharge Planning: Destination: PT-Home ,OT- Home , SLP-  Projected Follow-up: PT-Home health PT, OT-  Home health OT, SLP-  Projected Equipment Needs: PT-Rolling walker with 5" wheels, To be determined, OT- 3 in 1 bedside comode, Tub/shower bench, SLP-  Equipment Details: PT- , OT-  Patient/family involved in discharge planning: PT- Patient,  OT-Patient, SLP-   MD ELOS: 7-8 days Medical Rehab Prognosis:  Excellent Assessment: The patient has been admitted for CIR therapies with the diagnosis of polytrauma. The team will be addressing functional mobility, strength, stamina, balance, safety, adaptive techniques and equipment, self-care, bowel and bladder mgt, patient and caregiver education, ortho precautions, pain mgt, community reintegration. Goals have been set at mod I for mobility and self-care. Pt very motivated.    Ranelle Oyster, MD, FAAPMR      See Team Conference Notes for weekly updates to the plan of care

## 2016-09-27 NOTE — Progress Notes (Signed)
Social Work Patient ID: Peter Morrow, male   DOB: [birth year], 36 y.o.   MRN: [id number] Met with Beth-Workers Comp CM to give her paperwork for pt's equipment needs and follow up home health needs. She had not looked through the paperwork Jenny-SW sent yesterday. She plans to come back Monday and will ask if further information needed.

## 2016-09-27 NOTE — Progress Notes (Signed)
Social Work Patient ID: Kvon Mcilhenny, male   DOB: 1980-09-22, 36 y.o.   MRN: [id number] s  CSW spoke with pt's wife via telephone on 09-25-16 to update her on team conference discussion and targeted d/c date of 10-03-16.  She was pleased with this.  CSW knows wife from outside of the hospital and CSW asked wife if she was comfortable with CSW being on pt's case and she was.  CSW then met with pt to update him and complete assessment.  He was also comfortable with CSW assisting him.  Pt is pleased with d/c date and feels he will be ready by then, especially with family support.  He mentioned that his parents would likely have FMLA paperwork for MD/PA to complete.  CSW suggested that they send CSW the paperwork via fax or email and we would work to get it completed.  He was appreciative.  CSW also reached out to pt's Worker's Comp case Freight forwarder, [employer], to start working on arranging DME and HH.  CSW faxed her orders and medical documentation requested.  CSW will continue to follow and assist as needed.

## 2016-09-27 NOTE — Patient Care Conference (Signed)
Inpatient RehabilitationTeam Conference and Plan of Care Update Date: 09/25/2016   Time: 2:30 PM    Patient Name: Peter Morrow      Medical Record Number: [id number]  Date of Birth: June 20, 1980 Sex: Male         Room/Bed: 4M10C/4M10C-01 Payor Info: Payor: [employer]UE [employer]UE [employer] / Plan: [facility] [employer] PPO / Product Type: *No Product type* /    Admitting Diagnosis: Trauma Polytramua  Admit Date/Time:  09/24/2016  7:39 PM Admission Comments: No comment available   Primary Diagnosis:  <principal problem not specified> Principal Problem: <principal problem not specified>  Patient Active Problem List   Diagnosis Date Noted  . Abnormal urinalysis   . Hypokalemia   . Hypoalbuminemia due to protein-calorie malnutrition (HCC)   . Hyperglycemia   . Displaced segmental fracture of shaft of left tibia, initial encounter for closed fracture 09/19/2016  . Blunt trauma   . Fracture   . Pelvic ring fracture (HCC)   . Trauma   . Depression   . Tachycardia   . Tachypnea   . Leukocytosis   . Acute blood loss anemia   . Post-operative pain   . Injury of pelvis 09/17/2016    Expected Discharge Date: Expected Discharge Date: 10/03/16  Team Members Present: Physician leading conference: Dr. Maryla Morrow Social Worker Present: Staci Acosta, LCSW Nurse Present: Ronny Bacon, RN PT Present: Katherine Mantle, PT OT Present: Rosalio Loud, OT SLP Present: Feliberto Gottron, SLP PPS Coordinator present : Tora Duck, RN, CRRN     Current Status/Progress Goal Weekly Team Focus  Medical   Functional and mobility deficits secondary to left SI joint diastases, pelvic ring fracture, left tib-fib fx. Pt s/p SI Screw, IMN to left tib fx, hematoma evacuation by Dr. Carola Frost on 09/17/16  Improve mobility, transfers, labs  See above   Bowel/Bladder   Continent bowel and bladder; BM Q day, QOD.  No retention, no s/s infection  Monitor   Swallow/Nutrition/ Hydration             ADL's   Min asisst  transfers, total assist LB dressing  Mod I overall  ADL retraining, transfers, activity tolerance   Mobility   min assist stand pivot transfer with RW, mod assist gait x 18', min assist wc x 150'  modified independnent basic transfer and household gait x 50' ; supervision car tr and gait controlled 75'and community 74' supervision; modified indepdenent w/c x 150' controlled env  AROM LLE, strengthening, pain management, mobility and locomotion   Communication             Safety/Cognition/ Behavioral Observations            Pain   MAnaged at goal 2/10  with Ultram  2/10  Monitor   Skin   Incision lines CDI. No s/s infection.  Ecchymosis resolving.  No s/s infection  Monitor    Rehab Goals Patient on target to meet rehab goals: Yes Rehab Goals Revised: none *See Care Plan and progress notes for long and short-term goals.  Barriers to Discharge: Mobility, safety, transfers, leukocytosis, hypokalemia, ABLA, hyperglycemia    Possible Resolutions to Barriers:  Therapies, follow labs, Ua/Ucx ordered    Discharge Planning/Teaching Needs:  Pt will return to his home with his wife and extended family to assist with his recovery and transition to home.  Pt with mod I goals and is independent to direct his family in his care.   Team Discussion:  Pt with expected blood loss anemia  from trauma and surgery.  A1C ordered and urine collected due to increased WBC.  Pt is being evaluated today by therapists.  Left thigh high TED ordered.  Pt is currently min assist with PT and is slow and can't go very far due to pain, but has mod I goals.  Pt with 3 steps to enter home and has a half bath on main level, where he plans to stay for a while until he can manage a flight of stairs.  Family to assist.  Pt walked 44' with PT and will be supervision for car txs and stairs, but otherwise mod I  Revisions to Treatment Plan:  None - day of initial evals   Continued Need for Acute Rehabilitation Level of Care:  The patient requires daily medical management by a physician with specialized training in physical medicine and rehabilitation for the following conditions: Daily direction of a multidisciplinary physical rehabilitation program to ensure safe treatment while eliciting the highest outcome that is of practical value to the patient.: Yes Daily medical management of patient stability for increased activity during participation in an intensive rehabilitation regime.: Yes Daily analysis of laboratory values and/or radiology reports with any subsequent need for medication adjustment of medical intervention for : Post surgical problems;Other  Ludmila Ebarb, Vista Deck 09/27/2016, 3:06 PM

## 2016-09-28 ENCOUNTER — Inpatient Hospital Stay (HOSPITAL_COMMUNITY): Payer: Self-pay

## 2016-09-28 ENCOUNTER — Inpatient Hospital Stay (HOSPITAL_COMMUNITY): Payer: Self-pay | Admitting: Physical Therapy

## 2016-09-28 NOTE — Progress Notes (Signed)
Occupational Therapy Session Note  Patient Details  Name: Peter Morrow MRN: [id number] Date of Birth: September 24, 1980  Today's Date: 09/28/2016 OT Individual Time: 0034-9179 OT Individual Time Calculation (min): 75 min    Short Term Goals: Week 1:  OT Short Term Goal 1 (Week 1): STG = LTGs due to ELOS  Skilled Therapeutic Interventions/Progress Updates:    1:1. Focus on UB strengthening, functional mobility and standing balance/tolerance in prep for ADLs. Pt completes all transfers throughout session at supervision level with Vc for w/c postioning/set up and safety awareness.  Pt propels w/c to/from all therapeutic destinations (outside) with increased time and Vc for efficient propulsion technique. Pt stand pivot  transfers onto/from park bench with RW with supervision and Vc for safety awareness. Pt completes UE AROM exercises in all planes with 5# dumbells 2x10 reps with rest breaks in between each exercise. Pt propels w/c up/down ramp with increased time and VC for posture in chair. Pt picks up 6# med ball from ground  on R, brings to chest and places on L side of w/c while seated to simulate picking up toddler. In therapy gym, pt plays Wii sports games in standing ~20 min with 3 seated rest breaks. Pt reports LLE feeling "heavy and swollen." Discussed compression wrapping. Alerted RN. Exited session with pt seated in recliner with call light in reach and all needs met.   Therapy Documentation Precautions:  Precautions Precautions: Fall Restrictions Weight Bearing Restrictions: Yes RLE Weight Bearing: Weight bearing as tolerated LLE Weight Bearing: Touchdown weight bearing  See Function Navigator for Current Functional Status.   Therapy/Group: Individual Therapy  Tonny Branch 09/28/2016, 5:10 PM

## 2016-09-28 NOTE — Progress Notes (Signed)
Physical Therapy Session Note  Patient Details  Name: Peter Morrow MRN: [id number] Date of Birth: [birth year]  Today's Date: 09/28/2016 PT Individual Time: 1100-1205 PT Individual Time Calculation (min): 65 min   Short Term Goals: Week 1:  PT Short Term Goal 1 (Week 1): = LTGs due to ELOS  Skilled Therapeutic Interventions/Progress Updates: Pt received seated in w/c, denies pain and agreeable to treatment. W/c propulsion to/from gym modI with BUE. Pt requesting ELR for LLE d/t pain with prolonged sitting with LLE on standard leg rest; therapist obtained and educated pt in various positions and how to attach/remove. Stand pivot transfer x3 with RW and S to mat and new w/c. Nustep x11 min with BLE for LLE ROM. Stairs performed x2 trials; first trial with BUE support on rail and min cues to maintain LLE TDWB. Second trial with RUE crutch, LUE on rail. MinA for both trials, cues for technique and sequencing. Gait with crutches x30' with min guard, cues for crutch placement and gait pattern. Kinesiotape applied to L flank and low back hematomas to promote healing and reduce edema. Returned to room as above. Squat pivot transfer to recliner with S. Remained seated in recliner at end of session, RN present and all needs in reach.      Therapy Documentation Precautions:  Precautions Precautions: Fall Restrictions Weight Bearing Restrictions: Yes RLE Weight Bearing: Weight bearing as tolerated LLE Weight Bearing: Touchdown weight bearing Pain: Pain Assessment Pain Score: 0-No pain   See Function Navigator for Current Functional Status.   Therapy/Group: Individual Therapy  [name] 09/28/2016, 7:34 AM

## 2016-09-28 NOTE — Progress Notes (Signed)
Peter Morrow is a 36 y.o. male [birth year] [id number]  Subjective: No new complaints. Pain controlled with tramadol and asks about anything for "soreness" that does not require "pain med"?  No new problems. Slept well. Feeling OK.  Objective: Vital signs in last 24 hours: Temp:  [98.2 F (36.8 C)-99 F (37.2 C)] 99 F (37.2 C) (04/21 0500) Pulse Rate:  [100] 100 (04/21 0500) Resp:  [18] 18 (04/21 0500) BP: (133-136)/(81) 133/81 (04/21 0500) SpO2:  [98 %] 98 % (04/21 0500) Weight change:  Last BM Date: 09/26/16  Intake/Output from previous day: 04/20 0701 - 04/21 0700 In: 960 [P.O.:960] Out: 2050 [Urine:2050]  Physical Exam General: No apparent distress  Family at side Lungs: Normal effort. Lungs clear to auscultation, no crackles or wheezes. Cardiovascular: Regular rate and rhythm, no edema  Lab Results: BMET    Component Value Date/Time   NA 135 09/25/2016 0650   K 3.8 09/25/2016 0650   CL 101 09/25/2016 0650   CO2 24 09/25/2016 0650   GLUCOSE 117 (H) 09/25/2016 0650   BUN 15 09/25/2016 0650   CREATININE 0.91 09/25/2016 0650   CALCIUM 8.7 (L) 09/25/2016 0650   CALCIUM 8.0 (L) 09/21/2016 0421   GFRNONAA >60 09/25/2016 0650   GFRAA >60 09/25/2016 0650   CBC    Component Value Date/Time   WBC 10.6 (H) 09/25/2016 0650   RBC 3.52 (L) 09/25/2016 0650   HGB 10.1 (L) 09/25/2016 0650   HCT 30.8 (L) 09/25/2016 0650   PLT 333 09/25/2016 0650   MCV 87.5 09/25/2016 0650   MCH 28.7 09/25/2016 0650   MCHC 32.8 09/25/2016 0650   RDW 15.3 09/25/2016 0650   LYMPHSABS 1.8 09/25/2016 0650   MONOABS 0.9 09/25/2016 0650   EOSABS 0.1 09/25/2016 0650   BASOSABS 0.0 09/25/2016 0650   CBG's (last 3):  No results for input(s): GLUCAP in the last 72 hours. LFT's Lab Results  Component Value Date   ALT 108 (H) 09/27/2016   AST 47 (H) 09/27/2016   ALKPHOS 68 09/27/2016   BILITOT 1.9 (H) 09/27/2016    Studies/Results: No results found.  Medications:  I have reviewed  the patient's current medications. Scheduled Medications: . cholecalciferol  2,000 Units Oral BID  . enoxaparin (LOVENOX) injection  40 mg Subcutaneous Q24H  . feeding supplement  1 Container Oral TID BM  . feeding supplement (PRO-STAT SUGAR FREE 64)  30 mL Oral BID  . polyethylene glycol  17 g Oral BID  . traMADol  50 mg Oral Q6H  . vortioxetine HBr  10 mg Oral Daily   PRN Medications: acetaminophen, alum & mag hydroxide-simeth, bisacodyl, camphor-menthol, diphenhydrAMINE, diphenhydrAMINE-zinc acetate, guaiFENesin-dextromethorphan, meclizine, methocarbamol, ondansetron **OR** ondansetron (ZOFRAN) IV, polyethylene glycol, prochlorperazine **OR** prochlorperazine **OR** prochlorperazine, sodium phosphate, traZODone  Assessment/Plan: Principal Problem:   Trauma Active Problems:   Pelvic ring fracture (HCC)   Depression   Acute blood loss anemia   Hypoalbuminemia due to protein-calorie malnutrition (HCC)   Hyperglycemia  1. Debility following Polytrauma (L tib fx, Pelvic ring fx/instability) with tree limb accident on 4/10. Pain controlled, continue CIR therapies as ongoing and recommended prn tylenol or robaxin when pain not requiring tramadol 2. ABLA related to internal hematoma/degloving L flank; s/p PRBC transfusion - HD stable without s/sx continue blood loss 3. Anxiety/depression - continue meds as PTA - appears stable 4. Reactive hyperglycemia - normal a1c - follow up OP for potential metabolic syndrome and future DM risk  Length of stay, days: 4   Peter Morrow A.  Felicity Coyer, MD 09/28/2016, 12:11 PM

## 2016-09-28 NOTE — Progress Notes (Signed)
Occupational Therapy Session Note  Patient Details  Name: Peter Morrow MRN: [id number] Date of Birth: December 25, 1980  Today's Date: 09/28/2016 OT Individual Time: 4604-7998 OT Individual Time Calculation (min): 57 min    Short Term Goals: Week 1:  OT Short Term Goal 1 (Week 1): STG = LTGs due to ELOS  Skilled Therapeutic Interventions/Progress Updates:    1:1. Focus ADL retraining. Wife present upon arrival. Pt propels w/c to from tub room with increased time avoiding all obstacles wit supervision and Vc for eficient w/c propulsion pattern to protect shoulders. Pt stand pivot transfer with RW to/from w/c to TTB with supervision and Vc for safety awareness. Pt bathes 10/10 body parts in seated learning laterally to wash buttocks and using LHSS to wash B feet and back. While seated on TTB pt dons pull over shirt and pants with touching A to advance pants over hips in standing. In w/c pt elevates LLE onto trashcan to don sock and OT educated pt on use of shoe funnel to don L shoe. Pt able to return demonstrate without VC. Pt grooms in seated at sink to complete oral care, apply deodorant and shave. Exited session with pt seated in w/c with call light in reach and all needs met.  Therapy Documentation Precautions:  Precautions Precautions: Fall Restrictions Weight Bearing Restrictions: Yes RLE Weight Bearing: Weight bearing as tolerated LLE Weight Bearing: Touchdown weight bearing General:   Vital Signs:   Pain: Pain Assessment Pain Score: 0-No pain  See Function Navigator for Current Functional Status.   Therapy/Group: Individual Therapy  Tonny Branch 09/28/2016, 9:16 AM

## 2016-09-29 NOTE — Progress Notes (Signed)
Peter Morrow is a 36 y.o. male 20-Jul-1980 [id number]  Subjective: Curious if he will need another drain of hematoma before DC home - Pain controlled with only three dose tramadol yesterday and single tylenol - feels well  Objective: Vital signs in last 24 hours: Temp:  [98.6 F (37 C)-98.7 F (37.1 C)] 98.7 F (37.1 C) (04/22 0500) Pulse Rate:  [88-94] 88 (04/22 0500) Resp:  [18-19] 18 (04/22 0500) BP: (110-115)/(67-73) 115/67 (04/22 0500) SpO2:  [97 %-98 %] 97 % (04/22 0500) Weight change:  Last BM Date: 09/28/16  Intake/Output from previous day: 04/21 0701 - 04/22 0700 In: 480 [P.O.:480] Out: 1150 [Urine:1150]  Physical Exam General: No apparent distress  Family at side Lungs: Normal effort. Lungs clear to auscultation, no crackles or wheezes. Cardiovascular: Regular rate and rhythm, no edema Abd: consolidated induration over anterior L flank with hemoatoma, non tender or inflamed -  Bruising across L flank to posterior side and lateral L hip area  Lab Results: BMET    Component Value Date/Time   NA 135 09/25/2016 0650   K 3.8 09/25/2016 0650   CL 101 09/25/2016 0650   CO2 24 09/25/2016 0650   GLUCOSE 117 (H) 09/25/2016 0650   BUN 15 09/25/2016 0650   CREATININE 0.91 09/25/2016 0650   CALCIUM 8.7 (L) 09/25/2016 0650   CALCIUM 8.0 (L) 09/21/2016 0421   GFRNONAA >60 09/25/2016 0650   GFRAA >60 09/25/2016 0650   CBC    Component Value Date/Time   WBC 10.6 (H) 09/25/2016 0650   RBC 3.52 (L) 09/25/2016 0650   HGB 10.1 (L) 09/25/2016 0650   HCT 30.8 (L) 09/25/2016 0650   PLT 333 09/25/2016 0650   MCV 87.5 09/25/2016 0650   MCH 28.7 09/25/2016 0650   MCHC 32.8 09/25/2016 0650   RDW 15.3 09/25/2016 0650   LYMPHSABS 1.8 09/25/2016 0650   MONOABS 0.9 09/25/2016 0650   EOSABS 0.1 09/25/2016 0650   BASOSABS 0.0 09/25/2016 0650   CBG's (last 3):  No results for input(s): GLUCAP in the last 72 hours. LFT's Lab Results  Component Value Date   ALT 108 (H)  09/27/2016   AST 47 (H) 09/27/2016   ALKPHOS 68 09/27/2016   BILITOT 1.9 (H) 09/27/2016    Studies/Results: No results found.  Medications:  I have reviewed the patient's current medications. Scheduled Medications: . cholecalciferol  2,000 Units Oral BID  . enoxaparin (LOVENOX) injection  40 mg Subcutaneous Q24H  . feeding supplement  1 Container Oral TID BM  . feeding supplement (PRO-STAT SUGAR FREE 64)  30 mL Oral BID  . polyethylene glycol  17 g Oral BID  . traMADol  50 mg Oral Q6H  . vortioxetine HBr  10 mg Oral Daily   PRN Medications: acetaminophen, alum & mag hydroxide-simeth, bisacodyl, camphor-menthol, diphenhydrAMINE, diphenhydrAMINE-zinc acetate, guaiFENesin-dextromethorphan, meclizine, methocarbamol, ondansetron **OR** ondansetron (ZOFRAN) IV, polyethylene glycol, prochlorperazine **OR** prochlorperazine **OR** prochlorperazine, sodium phosphate, traZODone  Assessment/Plan: Principal Problem:   Trauma Active Problems:   Pelvic ring fracture (HCC)   Depression   Acute blood loss anemia   Hypoalbuminemia due to protein-calorie malnutrition (HCC)   Hyperglycemia  1. Debility following Polytrauma (L tib fx, Pelvic ring fx/instability) with tree limb accident on 4/10. Pain controlled, continue CIR therapies as ongoing and recommended prn tylenol or robaxin when pain not requiring tramadol 2. ABLA related to internal hematoma/degloving L flank; s/p PRBC transfusion - HD stable without s/sx continue blood loss 3. Anxiety/depression - continue meds as PTA - appears stable  4. Reactive hyperglycemia - normal a1c - follow up OP for potential metabolic syndrome and future DM risk  Length of stay, days: 5   Malaina Mortellaro A. Felicity Coyer, MD 09/29/2016, 10:38 AM

## 2016-09-30 ENCOUNTER — Inpatient Hospital Stay (HOSPITAL_COMMUNITY): Payer: Self-pay | Admitting: Physical Therapy

## 2016-09-30 ENCOUNTER — Inpatient Hospital Stay (HOSPITAL_COMMUNITY): Payer: Self-pay | Admitting: Occupational Therapy

## 2016-09-30 ENCOUNTER — Encounter (HOSPITAL_COMMUNITY): Payer: Self-pay | Admitting: Psychology

## 2016-09-30 DIAGNOSIS — S32810D Multiple fractures of pelvis with stable disruption of pelvic ring, subsequent encounter for fracture with routine healing: Secondary | ICD-10-CM

## 2016-09-30 DIAGNOSIS — S32810A Multiple fractures of pelvis with stable disruption of pelvic ring, initial encounter for closed fracture: Secondary | ICD-10-CM

## 2016-09-30 NOTE — Progress Notes (Signed)
Orthopedic Tech Progress Note Patient Details:  Peter Morrow 08-Jun-1981 [id number]  Ortho Devices Type of Ortho Device: Crutches Ortho Device/Splint Interventions: Application   [name] 09/30/2016, 3:19 PM

## 2016-09-30 NOTE — Consult Note (Signed)
Neuropsychological Consultation   Patient:   Peter Morrow   DOB:   April 02, 1981  MR Number:  [id number]  Location:  [facility] [address] [facility] [id number] [place] [address] Dept: [phone] Loc: [phone]           Date of Service:   09/30/2016  Start Time:   11 AM End Time:   11:55 AM  Provider/Observer:  Arley Phenix, Psy.D.       Clinical Neuropsychologist       Billing Code/Service: [phone] 4 Units  Chief Complaint:    Adjustment stress due to recent traumatic injury from a tree falling on him while he was at work. The patient suffered a pelvic ring fracture as well as tibia/fibular fracture in his left leg.  Reason for Service:  The patient was referred for neuropsychology/psychology due to some potential adjustment issues following his hospitalization and subsequent rehabilitation efforts. 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  The tree fell in the direction of the patient and he was unable to move out of the way. He was able to move enough to where the tree hit him but did not land and pin him. The patient has had orthopedic surgery on his leg and is now been referred for rehabilitative efforts. At this point, the plan for discharge is this coming Thursday April the 26th 2018.  Current Status:  The patient is had some struggles being away from his wife and very young child. The patient has felt isolated and has described increasing symptoms of depression. There is also been some issues of anxiety. He does have a prior history of diagnosis of anxiety.  Reliability of Information: Information is derived from the patient's medical records as well as discussions with treatment team and one hour clinical interview with the patient.  Behavioral Observation: Lorraine Terriquez  presents as a 36 y.o.-year-old Right Caucasian Male who appeared his stated age. his dress was  Appropriate and he was Well Groomed and his manners were Appropriate to the situation.  his participation was indicative of Appropriate and Attentive behaviors.  There were physical disabilities noted.  he displayed an appropriate level of cooperation and motivation.     Interactions:    Active Appropriate and Attentive  Attention:   within normal limits and attention span and concentration were age appropriate  Memory:   within normal limits; recent and remote memory intact  Visuo-spatial:  within normal limits  Speech (Volume):  normal  Speech:   normal; normal  Thought Process:  Coherent and Relevant  Though Content:  WNL; not suicidal  Orientation:   person, place, time/date and situation  Judgment:   Good  Planning:   Good  Affect:    Anxious  Mood:    Depressed  Insight:   Good  Intelligence:   high  Marital Status/Living: The patient is married with a 84-year-old son. His wife is pregnant.  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  Substance Use:  No concerns of substance abuse are reported.    Education:   Control and instrumentation engineer History:   Past Medical History:  Diagnosis Date  . Anxiety disorder    with history of panic attacks  . Depression   . Displaced segmental fracture of shaft of left tibia, initial encounter for closed fracture 09/19/2016  . Distal radius fracture--right 2017  Abuse/Trauma History: The patient was recently involved in a traumatic injury. The patient was assisting and spotting for the removal of a pine tree on one of the [facility] [employer] properties. There've been thought out plan for where the tree was to land and the patient was focused on this event. However, the tree came down a different direction and the patient was just barely able to move enough to keep the tree from pinning him to the ground. He did strike him fracturing his pelvic ring as well as his tibia/fibula. He does remember the events  around this quite clearly.  Psychiatric History:  The patient does have some prior history of anxiety but this is well managed and treated.  Family Med/Psych History:  Family History  Problem Relation Age of Onset  . Healthy Mother     history of GBS  . Healthy Father   . Other Brother     cystic mass on brain  . Scleroderma Maternal Grandmother   . Prostate cancer Maternal Grandfather     Risk of Suicide/Violence: virtually non-existent patient denies any suicidal or homicidal ideation.  Impression/DX:  The patient is a 36 year old male who was injured after tree struck him while being removed at work. He suffered a fractured leg as well as a fractured pelvic ring and has been treated here at the inpatient unit for rehabilitative efforts for return to home and work. The patient is struggled being here in the inpatient unit and being away from his wife and young son. His wife is also pregnant and he is very worried and has had some intrusive thoughts about not being there to help take care of things. However, there is family members have been able to intervene and this is helped with his anxiety greatly.  Disposition/Plan:  Worked on Producer, television/film/video and adapting to being in Hospital due to injury.  Will see patient again if needed due to anxiety and depressive symptoms.         Electronically Signed   _______________________ Arley Phenix, Psy.D.

## 2016-09-30 NOTE — Progress Notes (Signed)
Occupational Therapy Session Note  Patient Details  Name: Peter Morrow MRN: [id number] Date of Birth: Sep 12, 1980  Today's Date: 09/30/2016 OT Individual Time: 4098-1191 and 1400-1500 OT Individual Time Calculation (min): 45 min and 60 min   Short Term Goals: Week 1:  OT Short Term Goal 1 (Week 1): STG = LTGs due to ELOS  Skilled Therapeutic Interventions/Progress Updates:    1) Treatment session with focus on w/c parts management, functional transfers, and adaptive techniques to increase independence with self-care tasks.  Pt completed bed > w/c transfer with short distance ambulation with RW and supervision.  Completed tub/shower transfer in ADL tub room with RW and supervision to tub transfer bench.  Pt completed bathing at overall supervision level with lateral leans to wash buttocks and use of long handled sponge to wash lower legs and feet.  Utilized step stool when donning sock on Lt foot to increase reach towards foot.  Close supervision when standing to pull pants over hips.  Donned knee high TED stocking to LLE to manage swelling.  Left upright in w/c at sink to finish grooming tasks.  2) Treatment session with focus on trunk rotation, reaching, and BUE strengthening to increase independence with self-care tasks and lifting 2 yo son.  Reaching activity in sitting with trunk rotation incorporating reaching outside BOS both down towards floor and up while crossing midline to increase stretch.  Pt with difficulty when reaching down towards both Lt and Rt, reporting stretch in pelvis.  Applied kinesiotape to Lt flank and low back hematomas to promote healing and reduce edema.  Utilized 6# medicine ball in sitting with focus on trunk rotation in PNF pattern reaching as well as reaching towards floor and overhead.  Discussed AE recommendations for home, with pt no needing a sock aid but will require a shoe funnel and a reacher.    Therapy Documentation Precautions:   Precautions Precautions: Fall Restrictions Weight Bearing Restrictions: Yes RLE Weight Bearing: Weight bearing as tolerated LLE Weight Bearing: Touchdown weight bearing General:   Vital Signs: Therapy Vitals Temp: 98.4 F (36.9 C) Temp Source: Oral Pulse Rate: 77 Resp: 18 BP: 121/77 Patient Position (if appropriate): Lying Oxygen Therapy SpO2: 100 % O2 Device: Not Delivered Pain: Pain Assessment Pain Assessment: 0-10 Pain Score: 2  Pain Location: Hip Pain Orientation: Left Pain Descriptors / Indicators: Aching Pain Frequency: Intermittent Pain Onset: Gradual Pain Intervention(s): Medication (See eMAR)  See Function Navigator for Current Functional Status.   Therapy/Group: Individual Therapy  Rosalio Loud 09/30/2016, 8:49 AM

## 2016-09-30 NOTE — Progress Notes (Signed)
Physical Therapy Note  Patient Details  Name: Peter Morrow MRN: [id number] Date of Birth: Apr 07, 1981 Today's Date: 09/30/2016    Time: [phone] 85 minutes  1:1 Pt with no c/o pain at rest.  Gait with RW 30', 39' with distant supervision, improved ability to maintain TDWB.  Gait with crutches with supervision x 30' with increased effort. Pt/PT agree that pt should use RW for gait for energy conservation.  Stair negotiation attempt with RW on 6'' steps, pt unable due to difficulty unweighting Lt LE.  Stair negotiation with 1 crutch and railing with min A. Supine with LEs on theraball hip/knee flex/ext and LTR for stretching and AAROM. Nustep x 10 minutes level 1 for AAROM.  Ice applied to Lt LE after session.   DONAWERTH,KAREN 09/30/2016, 10:57 AM

## 2016-09-30 NOTE — Progress Notes (Signed)
[place] PHYSICAL MEDICINE & REHABILITATION     PROGRESS NOTE  Subjective/Complaints:  Pt seen sitting up in his chair this AM. He states he is making progress in therapies.  He slept well overnight.    ROS: Denies CP, SOB, N/V/D.  Objective: Vital Signs: Blood pressure 121/77, pulse 77, temperature 98.4 F (36.9 C), temperature source Oral, resp. rate 18, height 6' (1.829 m), weight 94.2 kg (207 lb 10.8 oz), SpO2 100 %. No results found. No results for input(s): WBC, HGB, HCT, PLT in the last 72 hours. No results for input(s): NA, K, CL, GLUCOSE, BUN, CREATININE, CALCIUM in the last 72 hours.  Invalid input(s): CO CBG (last 3)  No results for input(s): GLUCAP in the last 72 hours.  Wt Readings from Last 3 Encounters:  09/24/16 94.2 kg (207 lb 10.8 oz)  09/17/16 93 kg (205 lb)  04/05/12 86.2 kg (190 lb)    Physical Exam:  BP 121/77 (BP Location: Left Arm)   Pulse 77   Temp 98.4 F (36.9 C) (Oral)   Resp 18   Ht 6' (1.829 m)   Wt 94.2 kg (207 lb 10.8 oz)   SpO2 100%   BMI 28.17 kg/m  Constitutional: He appears well-developedand well-nourished. No distress.  HENT: Normocephalicand atraumatic.  Eyes: EOMI. No discharge.  Cardiovascular: RRR. No JVD. Respiratory: normal effort. Clear. GI: NT. Soft.   Musculoskeletal: He exhibits tendernessand deformity(left lateral hip due to hematoma).  Left hip and upper thigh with 2-3+ edema. Left shin with mod edema.  Neurological: He is alertand oriented.  Cognitively intact.  Bilateral UE 5/5.  RLE 5/5 proximal to distal LLE: HF 2+/5, KE 3+/5, ADF/PF 4/5 (continued pain inhibition) Skin: Large ecchymotic area left flank. Small incisions at hip and lower shin are clean, dry intact with sutures in place. Skin is warmand dry.  Psychiatric: He has a normal mood and affect. His behavior is normal. Judgmentand thought contentnormal   Assessment/Plan: 1. Functional deficits secondary to polytrauma which require 3+ hours  per day of interdisciplinary therapy in a comprehensive inpatient rehab setting. Physiatrist is providing close team supervision and 24 hour management of active medical problems listed below. Physiatrist and rehab team continue to assess barriers to discharge/monitor patient progress toward functional and medical goals.  Function:  Bathing Bathing position   Position: Shower  Bathing parts Body parts bathed by patient: Right arm, Left arm, Chest, Abdomen, Front perineal area, Right upper leg, Left upper leg, Right lower leg, Back, Left lower leg, Buttocks Body parts bathed by helper: Buttocks, Left lower leg  Bathing assist Assist Level: Set up      Upper Body Dressing/Undressing Upper body dressing   What is the patient wearing?: Pull over shirt/dress     Pull over shirt/dress - Perfomed by patient: Thread/unthread right sleeve, Thread/unthread left sleeve, Put head through opening, Pull shirt over trunk          Upper body assist Assist Level: Set up   Set up : To obtain clothing/put away  Lower Body Dressing/Undressing Lower body dressing   What is the patient wearing?: Pants, Non-skid slipper socks, Ted Hose     Pants- Performed by patient: Thread/unthread left pants leg, Pull pants up/down, Thread/unthread right pants leg Pants- Performed by helper: Thread/unthread right pants leg Non-skid slipper socks- Performed by patient: Don/doff right sock, Don/doff left sock Non-skid slipper socks- Performed by helper: Don/doff right sock, Don/doff left sock Socks - Performed by patient: Don/doff right sock Socks -  Performed by helper: Don/doff left sock Shoes - Performed by patient: Don/doff right shoe, Don/doff left shoe, Fasten right, Fasten left Shoes - Performed by helper: Don/doff left shoe, Fasten left       TED Hose - Performed by helper: Don/doff left TED hose  Lower body assist Assist for lower body dressing: Set up      Toileting Toileting   Toileting steps  completed by patient: Adjust clothing prior to toileting, Performs perineal hygiene, Adjust clothing after toileting      Toileting assist Assist level: Supervision or verbal cues   Transfers Chair/bed transfer   Chair/bed transfer method: Ambulatory Chair/bed transfer assist level: Supervision or verbal cues Chair/bed transfer assistive device: Armrests, Patent attorney     Max distance: 60 ft Assist level: Supervision or verbal cues   Wheelchair   Type: Manual Max wheelchair distance: 150 ft Assist Level: No help, No cues, assistive device, takes more than reasonable amount of time  Cognition Comprehension Comprehension assist level: Follows complex conversation/direction with no assist  Expression Expression assist level: Expresses complex ideas: With no assist  Social Interaction Social Interaction assist level: Interacts appropriately with others - No medications needed.  Problem Solving Problem solving assist level: Solves complex problems: Recognizes & self-corrects  Memory Memory assist level: Complete Independence: No helper    Medical Problem List and Plan: 1. Functional and mobility deficitssecondary to left SI joint diastases, pelvic ring fracture, left tib-fib fx. Pt s/p SI Screw, IMN to left tib fx, hematoma evacuation by Dr. Carola Frost on 09/17/16  Cont CIR 2. DVT Prophylaxis/Anticoagulation: Pharmaceutical: Lovenox--4 weeks recommended by ortho (~5/16).   Vascular study neg for DVT 3. Pain Management:Better controlled and now weaned to tramadol qid with robaxin prn  -cont ice, elevation of extremity.   May use heat prn around hematoma 4. Mood: team to provide ego support. LCSW to follow for evaluation and support.  5. Neuropsych: This patient iscapable of making decisions on hisown behalf. 6. Skin/Wound Care: Monitor wound for healing. Routine pressure relief measures.  7. Fluids/Electrolytes/Nutrition: Offer snacks between meals.  8. Pelvic  ring fracture s/p SI screw and comminuted left tibia Fx s/p IM nailing of left tibial shaft: TDWB LLE.  9. ABLA: improving post transfusion. Continue to monitor for signs of bleeding.   Hb 10.1 on 4/18  Cont to monitor  Labs ordered for tomorrow 10. AKI: Resolved.  11. Thrombocytopenia: Resolved  12. Hyperglycemia: Likely stress induced.   Hgb A1C WNL.  13. H/o anxiety disorder with depression: On Trintellix. Insomnia better with ativan.  14. Constipation: Augment bowel program as has been requiring suppository past couple of days.   Resolved 15. Hypokalemia  K+ 3.8 on 4/18  Labs ordered for tomorrow 16. Hypoalbuminemia  Supplement initiated 4/19 17. Leukocytosis  WBCs 10.6 on 4/18  UA relatively unremarkable, Ucx negative  Labs ordered for tomorrow  LOS (Days) 6 A FACE TO FACE EVALUATION WAS PERFORMED  Gilford Lardizabal Karis Juba 09/30/2016 8:56 AM

## 2016-10-01 ENCOUNTER — Inpatient Hospital Stay (HOSPITAL_COMMUNITY): Payer: Self-pay | Admitting: Physical Therapy

## 2016-10-01 ENCOUNTER — Inpatient Hospital Stay (HOSPITAL_COMMUNITY): Payer: Worker's Compensation | Admitting: Physical Therapy

## 2016-10-01 ENCOUNTER — Inpatient Hospital Stay (HOSPITAL_COMMUNITY): Payer: Self-pay | Admitting: Occupational Therapy

## 2016-10-01 DIAGNOSIS — F329 Major depressive disorder, single episode, unspecified: Secondary | ICD-10-CM

## 2016-10-01 LAB — CBC WITH DIFFERENTIAL/PLATELET
Basophils Absolute: 0 10*3/uL (ref 0.0–0.1)
Basophils Relative: 0 %
EOS ABS: 0.1 10*3/uL (ref 0.0–0.7)
EOS PCT: 1 %
HCT: 30.6 % — ABNORMAL LOW (ref 39.0–52.0)
Hemoglobin: 9.4 g/dL — ABNORMAL LOW (ref 13.0–17.0)
LYMPHS ABS: 1.8 10*3/uL (ref 0.7–4.0)
Lymphocytes Relative: 20 %
MCH: 27.2 pg (ref 26.0–34.0)
MCHC: 30.7 g/dL (ref 30.0–36.0)
MCV: 88.4 fL (ref 78.0–100.0)
MONO ABS: 0.8 10*3/uL (ref 0.1–1.0)
MONOS PCT: 9 %
Neutro Abs: 5.9 10*3/uL (ref 1.7–7.7)
Neutrophils Relative %: 70 %
PLATELETS: 478 10*3/uL — AB (ref 150–400)
RBC: 3.46 MIL/uL — ABNORMAL LOW (ref 4.22–5.81)
RDW: 15 % (ref 11.5–15.5)
WBC: 8.6 10*3/uL (ref 4.0–10.5)

## 2016-10-01 LAB — BASIC METABOLIC PANEL
ANION GAP: 5 (ref 5–15)
BUN: 19 mg/dL (ref 6–20)
CHLORIDE: 106 mmol/L (ref 101–111)
CO2: 26 mmol/L (ref 22–32)
CREATININE: 0.9 mg/dL (ref 0.61–1.24)
Calcium: 8.9 mg/dL (ref 8.9–10.3)
GFR calc non Af Amer: >60 mL/min (ref >60)
Glucose, Bld: 110 mg/dL — ABNORMAL HIGH (ref 65–99)
Potassium: 4.7 mmol/L (ref 3.5–5.1)
SODIUM: 137 mmol/L (ref 135–145)

## 2016-10-01 MED ORDER — ENOXAPARIN SODIUM 40 MG/0.4ML ~~LOC~~ SOLN: 40.0000 mg | SUBCUTANEOUS | 0 refills | Status: DC

## 2016-10-01 MED ORDER — CHOLECALCIFEROL 50 MCG (2000 UT) PO TABS: 2000.0000 [IU] | ORAL_TABLET | Freq: Two times a day (BID) | ORAL | 0 refills | Status: DC

## 2016-10-01 MED ORDER — METHOCARBAMOL 500 MG PO TABS: 500.0000 mg | ORAL_TABLET | Freq: Four times a day (QID) | ORAL | 0 refills | Status: DC | PRN

## 2016-10-01 MED ORDER — TRAMADOL HCL 50 MG PO TABS: 50.0000 mg | ORAL_TABLET | ORAL | Status: DC | PRN

## 2016-10-01 MED ORDER — POLYETHYLENE GLYCOL 3350 17 G PO PACK: 17.0000 g | PACK | Freq: Two times a day (BID) | ORAL | 0 refills | Status: DC

## 2016-10-01 MED ORDER — CAMPHOR-MENTHOL 0.5-0.5 % EX LOTN: TOPICAL_LOTION | CUTANEOUS | 0 refills | Status: DC | PRN

## 2016-10-01 MED ORDER — TRAMADOL HCL 50 MG PO TABS
50.0000 mg | ORAL_TABLET | Freq: Two times a day (BID) | ORAL | Status: DC
  Administered 2016-10-02 – 2016-10-03 (×2): 50 mg via ORAL
  Filled 2016-10-01 (×4): qty 1

## 2016-10-01 MED ORDER — TRAMADOL HCL 50 MG PO TABS: 50.0000 mg | ORAL_TABLET | Freq: Four times a day (QID) | ORAL | 0 refills | Status: DC | PRN
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
## 2016-10-01 NOTE — Progress Notes (Signed)
Removed sutures per MD order.  Dani Gobble, RN

## 2016-10-01 NOTE — Progress Notes (Signed)
Occupational Therapy Session Note  Patient Details  Name: Peter Morrow MRN: [id number] Date of Birth: April 13, 1981  Today's Date: 10/01/2016 OT Individual Time: 1515-1600 OT Individual Time Calculation (min): 45 min    Short Term Goals: Week 1:  OT Short Term Goal 1 (Week 1): STG = LTGs due to ELOS  Skilled Therapeutic Interventions/Progress Updates:    Treatment session with focus on w/c level transfers, trunk control, and w/c parts management.  Pt completed transfer recliner > w/c with RW supervision.  Discussed parts management with elevated leg rests and positioning of w/c prior to transfers.  Completed reaching activity with 6# dumb bells incorporating trunk rotation when reaching behind himself and towards the floor.  Discussed d/c planning with problem solving mobility in home.  Provided pt with shoe funnel, pt donned Lt shoe with increased time with shoe funnel.    Therapy Documentation Precautions:  Precautions Precautions: Fall Restrictions Weight Bearing Restrictions: Yes RLE Weight Bearing: Weight bearing as tolerated LLE Weight Bearing: Touchdown weight bearing General:   Vital Signs: Therapy Vitals Temp: 98.6 F (37 C) Temp Source: Oral Pulse Rate: 98 Resp: 18 BP: 128/73 Patient Position (if appropriate): Sitting Oxygen Therapy SpO2: 98 % O2 Device: Not Delivered Pain: Pain Assessment Pain Assessment: 0-10 Pain Score: 1  Pain Type: Acute pain Pain Location: Leg Pain Orientation: Left Pain Descriptors / Indicators: Aching Pain Frequency: Intermittent Pain Onset: With Activity Patients Stated Pain Goal: 0 Pain Intervention(s): Medication (See eMAR)  See Function Navigator for Current Functional Status.   Therapy/Group: Individual Therapy  Rosalio Loud 10/01/2016, 4:04 PM

## 2016-10-01 NOTE — Progress Notes (Signed)
Physical Therapy Session Note  Patient Details  Name: Peter Morrow MRN: [id number] Date of Birth: 12/02/1980  Today's Date: 10/01/2016 PT Individual Time: 0945-1100 PT Individual Time Calculation (min): 75 min   Short Term Goals: Week 1:  PT Short Term Goal 1 (Week 1): = LTGs due to ELOS  Skilled Therapeutic Interventions/Progress Updates: Pt presented in recliner agreeable to therapy. Performed squat pivot to w/c with mod I. Pt mod I in placing leg rests and propulsion around unit. Performed car transfer with RW requiring supervision. Propelled to rehab gym and performed squat pivot to NuStep. NuStep L1 x 10 min for LE AAROM. Therex performed including self stretching of HC/gastroc with sheet, long sit HS stretch 1 min x 3. Performed quad set x 10 3 sec hold. theraball HS pulls and LTR to fatigue. Pt ambulated approx 50f with RW with supervision. Pt returned to room and recliner in same manner as prior. PTA provided ice packs to pt and pt left with all current needs met.      Therapy Documentation Precautions:  Precautions Precautions: Fall Restrictions Weight Bearing Restrictions: Yes RLE Weight Bearing: Weight bearing as tolerated LLE Weight Bearing: Touchdown weight bearing General:   Vital Signs: Therapy Vitals Temp: 98.6 F (37 C) Temp Source: Oral Pulse Rate: 98 Resp: 18 BP: 128/73 Patient Position (if appropriate): Sitting Oxygen Therapy SpO2: 98 % O2 Device: Not Delivered   See Function Navigator for Current Functional Status.   Therapy/Group: Individual Therapy  Caid Radin  Parag Dorton, PTA  10/01/2016, 4:10 PM
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
## 2016-10-01 NOTE — Progress Notes (Signed)
Physical Therapy Note  Patient Details  Name: Peter Morrow MRN: [id number] Date of Birth: [birth year] Today's Date: 10/01/2016    Time: 1330-1440 70 minutes  1:1 No c/o pain.  Discussed w/c size and leg rest needs with pt and social worker, equipment company aware of need for 18 x18 w/c with extending leg rests.  w/c mobility with mod I throughout unit in home and controlled environment.  Gait with RW 100', 50' with mod I.  Curb step negotiation with pt attempting fwd and bkwd to ascend. Pt min A for ascend/descend curb step with RW.  Stair negotiation with 1 crutch and 1 handrail with close supervision x 4 stairs, cues for technique.  Side and bkwd stepping with RW with supervision, no LOB.  Car transfer to simulated small SUV with mod I. Ramp negotiation with RW with close supervision, no LOB.  Pt states he is feeling more comfortable to with gait with RW and feeling less pain in pelvis and sacrum.  Ice applied to Lt LE after session.   [name] 10/01/2016, 3:15 PM

## 2016-10-02 ENCOUNTER — Inpatient Hospital Stay (HOSPITAL_COMMUNITY): Payer: Worker's Compensation | Admitting: Occupational Therapy

## 2016-10-02 ENCOUNTER — Inpatient Hospital Stay (HOSPITAL_COMMUNITY): Payer: Self-pay | Admitting: Physical Therapy

## 2016-10-02 ENCOUNTER — Inpatient Hospital Stay (HOSPITAL_COMMUNITY): Payer: Worker's Compensation

## 2016-10-02 DIAGNOSIS — R269 Unspecified abnormalities of gait and mobility: Secondary | ICD-10-CM

## 2016-10-02 DIAGNOSIS — K5901 Slow transit constipation: Secondary | ICD-10-CM

## 2016-10-02 MED ORDER — POLYSACCHARIDE IRON COMPLEX 150 MG PO CAPS
150.0000 mg | ORAL_CAPSULE | Freq: Two times a day (BID) | ORAL | Status: DC
  Administered 2016-10-02 (×2): 150 mg via ORAL
  Filled 2016-10-02 (×2): qty 1

## 2016-10-02 MED ORDER — ENOXAPARIN (LOVENOX) PATIENT EDUCATION KIT
PACK | Freq: Once | Status: AC
  Administered 2016-10-02: 12:00:00
  Filled 2016-10-02: qty 1

## 2016-10-02 MED ORDER — POLYETHYLENE GLYCOL 3350 17 G PO PACK
17.0000 g | PACK | Freq: Every day | ORAL | Status: DC
  Filled 2016-10-02: qty 1

## 2016-10-02 NOTE — Progress Notes (Signed)
Physical Therapy Session Note  Patient Details  Name: Peter Morrow MRN: [id number] Date of Birth: 29-Mar-1981  Today's Date: 10/02/2016 PT Individual Time: 0900-1000 PT Individual Time Calculation (min): 60 min   Short Term Goals: Week 1:  PT Short Term Goal 1 (Week 1): = LTGs due to ELOS  Skilled Therapeutic Interventions/Progress Updates: pt received seated in w/c with c/o pain as below and agreeable to treatment. W/c propulsion throughout session modI with BUE. Performed car transfer modI with RW and ambulatory approach. Gait on ramp with modI; S for mulch and curb step with minimal cueing for technique and safety. Ambulatory transfer w/c <>flat bed in ADL apartment; modI bed mobility with use of UEs to manage LLE. Transfer w/c <>mat table modI squat pivot. Long sitting hamstring stretch with gastroc/soleus stretch with strap. Sidelying x2 min for stretching/fluid management. Prone lying with BLE quad stretch; educated pt on importance of prone lying for hip flexor stretch. Returned to supine with minA for LLE management. Transfer mat>w/c>recliner with modI squat pivot. Remained seated in recliner at completion of session, all needs in reach.      Therapy Documentation Precautions:  Precautions Precautions: Fall Restrictions Weight Bearing Restrictions: Yes RLE Weight Bearing: Weight bearing as tolerated LLE Weight Bearing: Touchdown weight bearing Pain: Pain Assessment Pain Assessment: 0-10 Pain Score: 1  Pain Type: Acute pain Pain Location: Leg Pain Orientation: Left Pain Descriptors / Indicators: Aching Pain Frequency: Intermittent Pain Intervention(s): Repositioned   See Function Navigator for Current Functional Status.   Therapy/Group: Individual Therapy  [name] 10/02/2016, 9:51 AM

## 2016-10-02 NOTE — Progress Notes (Signed)
Physical Therapy Discharge Summary  Patient Details  Name: Peter Morrow MRN: [id number] Date of Birth: 07/29/1980  Today's Date: 10/02/2016 PT Individual Time: 0900-1000 PT Individual Time Calculation (min): 60 min    Patient has met 11 of 11 long term goals due to improved activity tolerance, improved balance, improved postural control, increased strength, increased range of motion, decreased pain and functional use of  right upper extremity, right lower extremity, left upper extremity and left lower extremity.  Patient to discharge at an ambulatory level Modified Independent; S for stairs and car transfers, with use of w/c for community mobility.   Patient's care partner is independent to provide the necessary physical assistance at discharge.  Reasons goals not met: All goals met  Recommendation:  Patient will benefit from ongoing skilled PT services in outpatient setting to continue to advance safe functional mobility, address ongoing impairments in strength, ROM, pain, activity tolerance, and minimize fall risk.  Equipment: W/c, RW  Reasons for discharge: treatment goals met and discharge from hospital  Patient/family agrees with progress made and goals achieved: Yes  PT Discharge Precautions/Restrictions Restrictions Weight Bearing Restrictions: Yes RLE Weight Bearing: Weight bearing as tolerated LLE Weight Bearing: Touchdown weight bearing Pain Pain Assessment Pain Assessment: 0-10 Pain Score: 1  Pain Location: Leg Pain Orientation: Left Pain Intervention(s): Repositioned Vision/Perception    WFL; wears glasses at all times Cognition Orientation Level: Oriented X4 Sensation Sensation Light Touch: Appears Intact Proprioception: Appears Intact Coordination Gross Motor Movements are Fluid and Coordinated: No Fine Motor Movements are Fluid and Coordinated: Yes Finger Nose Finger Test: [facility] Heel Shin Test: NT Motor  Motor Motor - Discharge Observations:  pain/strength deficits post-op  Mobility Bed Mobility Bed Mobility: Rolling Right;Rolling Left;Supine to Sit;Sit to Supine Rolling Right: 6: Modified independent (Device/Increase time) Rolling Left: 6: Modified independent (Device/Increase time) Supine to Sit: 6: Modified independent (Device/Increase time) Sit to Supine: 6: Modified independent (Device/Increase time) Transfers Transfers: Yes Sit to Stand: 6: Modified independent (Device/Increase time);With armrests Stand Pivot Transfers: 6: Modified independent (Device/Increase time);With armrests Locomotion  Ambulation Ambulation: Yes Ambulation/Gait Assistance: 6: Modified independent (Device/Increase time) Ambulation Distance (Feet): 50 Feet Assistive device: Rolling walker Gait Gait: Yes Gait Pattern: Impaired Gait Pattern: Step-to pattern (LLE TDWB) Gait velocity: decreased Stairs / Additional Locomotion Stairs: Yes Stair Management Technique: Step to pattern;Forwards;With crutches;One rail Left (One rail and one crutch) Number of Stairs: 4 Height of Stairs: 6 Ramp: 5: Supervision Curb: 5: Psychiatric nurse: Yes Wheelchair Assistance: 6: Modified independent (Device/Increase time) Environmental health practitioner: Both upper extremities Wheelchair Parts Management: Independent Distance: >150'  Trunk/Postural Assessment  Cervical Assessment Cervical Assessment: Within Functional Limits Thoracic Assessment Thoracic Assessment: Within Functional Limits Lumbar Assessment Lumbar Assessment: Within Functional Limits Postural Control Postural Control: Deficits on evaluation Trunk Control: unable to perform stepping strategies d/t WB precautions  Balance Balance Balance Assessed: Yes Static Sitting Balance Static Sitting - Level of Assistance: 6: Modified independent (Device/Increase time) Dynamic Sitting Balance Dynamic Sitting - Level of Assistance: 6: Modified independent (Device/Increase  time) Static Standing Balance Static Standing - Level of Assistance: 6: Modified independent (Device/Increase time) (BUE support) Dynamic Standing Balance Dynamic Standing - Level of Assistance: 6: Modified independent (Device/Increase time) (BUE support) Extremity Assessment   BUE WFL   RLE Assessment RLE Assessment: Within Functional Limits LLE Assessment LLE Assessment: Exceptions to WFL (moderate pitting edema groin to foot) LLE PROM (degrees) LLE Overall PROM Comments: knee flexion limited to approximately 90 degrees LLE Strength LLE Overall Strength  Comments: 2+/5 hip flexion, 3/5 knee extension, 3-/5 ankle dorsiflexion   See Function Navigator for Current Functional Status.  Peter Morrow 10/02/2016, 11:19 AM

## 2016-10-02 NOTE — Progress Notes (Signed)
[facility] PHYSICAL MEDICINE & REHABILITATION     PROGRESS NOTE  Subjective/Complaints:  Pt sitting in his chair this AM.  He notes difficulty getting comfortable last night.  He has questions about his discharge timing tomorrow.   ROS: Denies CP, SOB, N/V/D.  Objective: Vital Signs: Blood pressure 128/73, pulse 85, temperature 98.7 F (37.1 C), temperature source Oral, resp. rate 16, height 6' (1.829 m), weight 94.2 kg (207 lb 10.8 oz), SpO2 97 %. No results found.  Recent Labs  10/01/16 0505  WBC 8.6  HGB 9.4*  HCT 30.6*  PLT 478*    Recent Labs  10/01/16 0505  NA 137  K 4.7  CL 106  GLUCOSE 110*  BUN 19  CREATININE 0.90  CALCIUM 8.9   CBG (last 3)  No results for input(s): GLUCAP in the last 72 hours.  Wt Readings from Last 3 Encounters:  09/24/16 94.2 kg (207 lb 10.8 oz)  09/17/16 93 kg (205 lb)  04/05/12 86.2 kg (190 lb)    Physical Exam:  BP 128/73 (BP Location: Left Arm)   Pulse 85   Temp 98.7 F (37.1 C) (Oral)   Resp 16   Ht 6' (1.829 m)   Wt 94.2 kg (207 lb 10.8 oz)   SpO2 97%   BMI 28.17 kg/m  Constitutional: He appears well-developedand well-nourished. No distress.  HENT: Normocephalicand atraumatic.  Eyes: EOMI. No discharge.  Cardiovascular: RRR. No JVD. Respiratory: normal effort. Clear. GI: NT. Soft.   Musculoskeletal: He exhibits tendernessand deformityand edema (left lateral hip due to hematoma).  Neurological: He is alertand oriented.  Cognitively intact.  Bilateral UE 5/5.  RLE 5/5 proximal to distal LLE: HF 3/5, KE 3+/5, ADF/PF 4/5 (improving) Skin: Large ecchymotic area left flank. Small incisions at hip and lower shin are clean, dry intact  Psychiatric: He has a normal mood and affect. His behavior is normal. Judgmentand thought contentnormal   Assessment/Plan: 1. Functional deficits secondary to polytrauma which require 3+ hours per day of interdisciplinary therapy in a comprehensive inpatient rehab  setting. Physiatrist is providing close team supervision and 24 hour management of active medical problems listed below. Physiatrist and rehab team continue to assess barriers to discharge/monitor patient progress toward functional and medical goals.  Function:  Bathing Bathing position   Position: Shower  Bathing parts Body parts bathed by patient: Right arm, Left arm, Chest, Abdomen, Front perineal area, Right upper leg, Left upper leg, Right lower leg, Back, Left lower leg, Buttocks Body parts bathed by helper: Buttocks, Left lower leg  Bathing assist Assist Level: More than reasonable time, Assistive device Assistive Device Comment: long handled sponge    Upper Body Dressing/Undressing Upper body dressing   What is the patient wearing?: Pull over shirt/dress     Pull over shirt/dress - Perfomed by patient: Thread/unthread right sleeve, Thread/unthread left sleeve, Put head through opening, Pull shirt over trunk          Upper body assist Assist Level: More than reasonable time   Set up : To obtain clothing/put away  Lower Body Dressing/Undressing Lower body dressing   What is the patient wearing?: Underwear, Pants, Non-skid slipper socks, Shoes Underwear - Performed by patient: Thread/unthread right underwear leg, Thread/unthread left underwear leg, Pull underwear up/down   Pants- Performed by patient: Thread/unthread right pants leg, Thread/unthread left pants leg, Pull pants up/down Pants- Performed by helper: Thread/unthread right pants leg Non-skid slipper socks- Performed by patient: Don/doff right sock, Don/doff left sock Non-skid slipper socks-  Performed by helper: Don/doff right sock, Don/doff left sock Socks - Performed by patient: Don/doff right sock Socks - Performed by helper: Don/doff left sock Shoes - Performed by patient: Don/doff left shoe, Fasten right, Fasten left, Don/doff right shoe Shoes - Performed by helper: Don/doff left shoe, Fasten left        TED Hose - Performed by helper: Don/doff left TED hose  Lower body assist Assist for lower body dressing: More than reasonable time, Assistive device Assistive Device Comment: shoe funnel and step stool    Toileting Toileting Toileting activity did not occur: No continent bowel/bladder event Toileting steps completed by patient: Adjust clothing prior to toileting, Performs perineal hygiene, Adjust clothing after toileting      Toileting assist Assist level: More than reasonable time   Transfers Chair/bed transfer   Chair/bed transfer method: Ambulatory Chair/bed transfer assist level: No Help, no cues, assistive device, takes more than a reasonable amount of time Chair/bed transfer assistive device: Armrests, Patent attorney     Max distance: 100 Assist level: No help, No cues, assistive device, takes more than a reasonable amount of time   Wheelchair   Type: Manual Max wheelchair distance: 150 ft Assist Level: No help, No cues, assistive device, takes more than reasonable amount of time  Cognition Comprehension Comprehension assist level: Follows complex conversation/direction with no assist  Expression Expression assist level: Expresses complex ideas: With no assist  Social Interaction Social Interaction assist level: Interacts appropriately with others - No medications needed.  Problem Solving Problem solving assist level: Solves complex problems: Recognizes & self-corrects  Memory Memory assist level: Complete Independence: No helper    Medical Problem List and Plan: 1. Functional and mobility deficitssecondary to left SI joint diastases, pelvic ring fracture, left tib-fib fx. Pt s/p SI Screw, IMN to left tib fx, hematoma evacuation by Dr. Carola Frost on 09/17/16  Cont CIR, plan for d/c tomorrow 2. DVT Prophylaxis/Anticoagulation: Pharmaceutical: Lovenox--4 weeks recommended by ortho (~5/11).   Vascular study neg for DVT 3. Pain Management:Better controlled  and now weaned to tramadol qid with robaxin prn  -cont ice, elevation of extremity.   May use heat prn around hematoma 4. Mood: team to provide ego support. LCSW to follow for evaluation and support.  5. Neuropsych: This patient iscapable of making decisions on hisown behalf. 6. Skin/Wound Care: Monitor wound for healing. Routine pressure relief measures.  7. Fluids/Electrolytes/Nutrition: Offer snacks between meals.  8. Pelvic ring fracture s/p SI screw and comminuted left tibia Fx s/p IM nailing of left tibial shaft: TDWB LLE.  9. ABLA: improving post transfusion. Continue to monitor for signs of bleeding.   Hb 9.4 on 4/24  Cont to monitor 10. AKI: Resolved.  11. Thrombocytopenia: Resolved  12. Hyperglycemia: Likely stress induced.   Hgb A1C WNL.   Overall controlled 4/25 13. H/o anxiety disorder with depression: On Trintellix. Insomnia better with ativan.  14. Constipation: Augmented bowel program.   Improved with meds 15. Hypokalemia  K+ 4.7 on 4/24 16. Hypoalbuminemia  Supplement initiated 4/19 17. Leukocytosis: Resolved  WBCs 8.6 on 4/24  UA relatively unremarkable, Ucx negative  LOS (Days) 8 A FACE TO FACE EVALUATION WAS PERFORMED  Laird Runnion Karis Juba 10/02/2016 8:23 AM

## 2016-10-02 NOTE — Progress Notes (Signed)
Physical Therapy Note  Patient Details  Name: Peter Morrow MRN: [id number] Date of Birth: Jun 08, 1981 Today's Date: 10/02/2016  1300-1430, 90 min idnvidiual tx Pain: 1/10, groin, premedicated TDWBing LLE  Family ed with falther in law, Theodoro Grist, for simulated car transfer to SUV ht, gait up/down (4) 6" high steps with L rail, R crutch,with supervision,  Cues for sequencing and safety. Also , with demo,  self heel cord and hamstring stretching, in sitting, using foot stool for LLE to avoid wt bearing, x 2 min BID.  Theodoro Grist return demonstrated safely all aspects of mobility, ex, and gait on steps..Hand out provided. w/c propulsion in community setting on/off elevator, over carpet and in crowded areas, supervision.  Gait with RW up/down sloped concrete sidewalk outdoors, supervision.  Cushion for rental w/c is 18 x 16, too small for 18 x 18 w/c.  Boneta Lucks, CSW notified.  PT educated pt on optimum sitting position in w/c, and adjusted R ELE for 90 degree hip and knee angles, with foot plate at lowest position/angle.  Pt left resting in recliner with all needs within reach.  See function navigator for current status.   [name] 10/02/2016, 1:49 PM

## 2016-10-02 NOTE — Progress Notes (Signed)
Occupational Therapy Discharge Summary  Patient Details  Name: Peter Morrow MRN: [id number] Date of Birth: 1980/07/11    Patient has met 9 of 9 long term goals due to improved activity tolerance, improved balance, ability to compensate for deficits, improved awareness and improved coordination.  Patient to discharge at overall Modified Independent level, supervision tub/shower transfer and meal prep.  Patient's care partner is independent to provide the necessary intermittent assistance at discharge.    Reasons goals not met: NA  Recommendation:  Patient will benefit from ongoing skilled OT services in home health setting to continue to advance functional skills in the area of BADL, iADL and Reduce care partner burden.  Equipment: tub transfer bench and 3 in 1  Reasons for discharge: treatment goals met and discharge from hospital  Patient/family agrees with progress made and goals achieved: Yes  OT Discharge Precautions/Restrictions  Precautions Precautions: Fall Restrictions Weight Bearing Restrictions: Yes RLE Weight Bearing: Weight bearing as tolerated LLE Weight Bearing: Touchdown weight bearing Pain  Pt with c/o pain 2 in Lt flank, premedicated ADL  See Function Navigator Vision/Perception  Vision- History Baseline Vision/History: Wears glasses Wears Glasses: At all times Patient Visual Report: No change from baseline Vision- Assessment Vision Assessment?: No apparent visual deficits Eye Alignment: Within Functional Limits Perception Perception: Within Functional Limits  Cognition Overall Cognitive Status: Within Functional Limits for tasks assessed Arousal/Alertness: Awake/alert Orientation Level: Oriented X4 Attention: Alternating Selective Attention: Appears intact Alternating Attention: Appears intact Memory: Appears intact Awareness: Appears intact Problem Solving: Appears intact Safety/Judgment: Appears intact Sensation Sensation Light Touch:  Appears Intact (BUE) Proprioception: Appears Intact Coordination Gross Motor Movements are Fluid and Coordinated: No Fine Motor Movements are Fluid and Coordinated: Yes Finger Nose Finger Test: [facility] Mobility  Bed Mobility Bed Mobility: Rolling Right;Rolling Left;Supine to Sit;Sit to Supine Rolling Right: 6: Modified independent (Device/Increase time) Rolling Left: 6: Modified independent (Device/Increase time) Supine to Sit: 6: Modified independent (Device/Increase time) Sit to Supine: 6: Modified independent (Device/Increase time) Transfers Sit to Stand: 6: Modified independent (Device/Increase time);With armrests  Extremity/Trunk Assessment RUE Assessment RUE Assessment: Within Functional Limits LUE Assessment LUE Assessment: Within Functional Limits   See Function Navigator for Current Functional Status.  Simonne Come 10/02/2016, 12:27 PM

## 2016-10-02 NOTE — Progress Notes (Signed)
Occupational Therapy Session Note  Patient Details  Name: Nikita Humble MRN: [id number] Date of Birth: 1981/04/26  Today's Date: 10/02/2016 OT Individual Time: 0700-0800 OT Individual Time Calculation (min): 60 min    Short Term Goals: Week 1:  OT Short Term Goal 1 (Week 1): STG = LTGs due to ELOS  Skilled Therapeutic Interventions/Progress Updates:    Completed ADL retraining at overall Mod I level.  Pt completed bathing in ADL tubroom shower with lateral leans on tub bench and use of long handled sponge.  Supervision for transfer in/out of shower with RW with use of tub transfer bench.  Pt completed all dressing with increased time, utilizing step stool to don Lt sock and shoe funnel when donning Lt shoe.  Grooming completed in sitting.  Engaged in simulated meal prep in sitting and standing with focus on safe hand placement and use of RW.  Educated on ensuring hand placement on RW and use of walker bag vs seated in w/c vs sliding items along counter top.  Pt to have family present to assist with meal prep and setup as needed.  Completed toilet transfers with RW at Mod I level.  Pt reports no further questions, ready for d/c tomorrow.  Therapy Documentation Precautions:  Precautions Precautions: Fall Restrictions Weight Bearing Restrictions: Yes RLE Weight Bearing: Weight bearing as tolerated LLE Weight Bearing: Touchdown weight bearing General:   Vital Signs: Therapy Vitals Temp: 98.7 F (37.1 C) Temp Source: Oral Pulse Rate: 85 Resp: 16 BP: 128/73 Patient Position (if appropriate): Lying Oxygen Therapy SpO2: 97 % O2 Device: Not Delivered Pain: Pain Assessment Pain Assessment: 0-10 Pain Score: 2  Pain Type: Acute pain Pain Location: Flank Pain Orientation: Left Pain Descriptors / Indicators: Aching Pain Frequency: Intermittent Pain Intervention(s): Medication (See eMAR)  See Function Navigator for Current Functional Status.   Therapy/Group: Individual  Therapy  Rosalio Loud 10/02/2016, 7:46 AM

## 2016-10-03 DIAGNOSIS — F411 Generalized anxiety disorder: Secondary | ICD-10-CM

## 2016-10-03 MED ORDER — ENOXAPARIN SODIUM 40 MG/0.4ML ~~LOC~~ SOLN: 40.0000 mg | SUBCUTANEOUS | 0 refills | Status: DC

## 2016-10-03 MED ORDER — POLYETHYLENE GLYCOL 3350 17 G PO PACK: 17.0000 g | PACK | Freq: Every day | ORAL | 0 refills | Status: DC

## 2016-10-03 MED ORDER — POLYSACCHARIDE IRON COMPLEX 150 MG PO CAPS: 150.0000 mg | ORAL_CAPSULE | Freq: Two times a day (BID) | ORAL | 0 refills | Status: DC

## 2016-10-03 MED ORDER — TRAMADOL HCL 50 MG PO TABS: 50.0000 mg | ORAL_TABLET | Freq: Four times a day (QID) | ORAL | 0 refills | Status: DC | PRN

## 2016-10-03 NOTE — Progress Notes (Signed)
Social Work Discharge Note  The overall goal for the admission was met for:   Discharge location: Yes - home with family  Length of Stay: Yes - 9 days  Discharge activity level: Yes - modified independent  Home/community participation: Yes   Services provided included: MD, RD, PT, OT, RN, Pharmacy, Neuropsych and SW  Financial Services: Worker's Comp  Follow-up services arranged: Home Health: PT/OT/RN from [facility], DME: 18"x18" lightweight w/c; rolling walker; hospital bed; bedside commode; tub bench from [facility] and Patient/Family has no preference for HH/DME agencies  Comments (or additional information): Pt will have family support at home from various members.  Father-in-law was trained on providing supervision on the stairs.  Pt and his wife feel prepared to d/c home.  Patient/Family verbalized understanding of follow-up arrangements: Yes  Individual responsible for coordination of the follow-up plan: pt with support from his family  Confirmed correct DME delivered: Prevatt, Jennifer Capps 10/03/2016    Prevatt, Jennifer Capps 

## 2016-10-03 NOTE — Discharge Instructions (Signed)
Inpatient Rehab Discharge Instructions  Peter Morrow Discharge date and time:  10/03/16  Activities/Precautions/ Functional Status: Activity: no lifting, driving, or strenuous exercise  till cleared by MD Diet: regular diet Wound Care: keep wound clean and dry.  Contact MD if you develop any problems with your incision/wound--redness, swelling, increase in pain, drainage or if you develop fever or chills.   Functional status:  ___ No restrictions     ___ Walk up steps independently ___ 24/7 supervision/assistance   ___ Walk up steps with assistance _X__ Intermittent supervision/assistance  _X__ Bathe/dress independently ___ Walk with walker     ___ Bathe/dress with assistance ___ Walk Independently    ___ Shower independently ___ Walk with assistance    ___ Shower with assistance _X__ No alcohol     ___ Return to work/school ________    COMMUNITY REFERRALS UPON DISCHARGE:   Home Health:   PT     OT     RN   Agency:  [facility]  Phone:  [phone] Medical Equipment/Items Ordered:  18"x18" lightweight wheelchair with elevating leg rests, anti-tippers, and brake extenders with 18"x18" basic cushion; rolling walker; tub transfer bench; bedside commode; hospital bed for home use with half rails with alternating pressure relief pad  Agency/Supplier:  [facility]        Phone:  [phone]   Special Instructions: 1. Limit to touch down weight on left leg. Use walker for walking.    My questions have been answered and I understand these instructions. I will adhere to these goals and the provided educational materials after my discharge from the hospital.  Patient/Caregiver Signature _______________________________ Date __________  Clinician Signature _______________________________________ Date __________  Please bring this form and your medication list with you to all your follow-up doctor's appointments.

## 2016-10-03 NOTE — Progress Notes (Signed)
Social Work Patient ID: Peter Morrow, male   DOB: September 13, 1980, 36 y.o.   MRN: [id number]   CSW met with pt and later talked with his wife via telephone to update them on 10-02-16 team conference discussion.  They both feel pt is ready for d/c and are appreciative of CIR care and assistance from [employer].  CSW made multiple phone calls and faxes to work out pt's DME and [place] through [employer] third party agencies.  CSW kept in good communication with Worker's Comp Tourist information centre manager from [employer].  Also called [facility] and made sure they had 40 mg Lovenox (generic) in stock for 11 doses.  Also gave him signed handicap placard application to take to [facility] [employer] to obtain placard.  CSW remains available, as needed.

## 2016-10-03 NOTE — Progress Notes (Signed)
Pt discharged to home accompanied by father in law. VSS, denies pain/discomfort at present. Verbalizes understanding of D/C instructions.  Lovenox ed complete.

## 2016-10-03 NOTE — Progress Notes (Signed)
[facility] PHYSICAL MEDICINE & REHABILITATION     PROGRESS NOTE  Subjective/Complaints:  Pt sitting up in his chair this AM.  He is ready for discharge.  He remains slightly anxious.  ROS: Denies CP, SOB, N/V/D.  Objective: Vital Signs: Blood pressure 114/69, pulse 87, temperature 98.7 F (37.1 C), temperature source Oral, resp. rate 18, height 6' (1.829 m), weight 93.4 kg (206 lb), SpO2 98 %. No results found.  Recent Labs  10/01/16 0505  WBC 8.6  HGB 9.4*  HCT 30.6*  PLT 478*    Recent Labs  10/01/16 0505  NA 137  K 4.7  CL 106  GLUCOSE 110*  BUN 19  CREATININE 0.90  CALCIUM 8.9   CBG (last 3)  No results for input(s): GLUCAP in the last 72 hours.  Wt Readings from Last 3 Encounters:  10/02/16 93.4 kg (206 lb)  09/17/16 93 kg (205 lb)  04/05/12 86.2 kg (190 lb)    Physical Exam:  BP 114/69 (BP Location: Left Arm)   Pulse 87   Temp 98.7 F (37.1 C) (Oral)   Resp 18   Ht 6' (1.829 m)   Wt 93.4 kg (206 lb)   SpO2 98%   BMI 27.94 kg/m  Constitutional: He appears well-developedand well-nourished. No distress.  HENT: Normocephalicand atraumatic.  Eyes: EOMI. No discharge.  Cardiovascular: RRR. No JVD. Respiratory: Normal effort. Clear. GI: NT. Soft.   Musculoskeletal: He exhibits tenderness and edema (left lateral hip due to hematoma, improving).  Neurological: He is alertand oriented.  Cognitively intact.  Bilateral UE 5/5.  RLE 5/5 proximal to distal LLE: HF 3+/5, KE 4-/5, ADF/PF 4/5 (improving) Skin: Large ecchymotic area left flank. Small incisions at hip and lower shin are clean, dry intact  Psychiatric: He has a normal mood and affect. His behavior is normal. Judgmentand thought contentnormal   Assessment/Plan: 1. Functional deficits secondary to polytrauma which require 3+ hours per day of interdisciplinary therapy in a comprehensive inpatient rehab setting. Physiatrist is providing close team supervision and 24 hour management of  active medical problems listed below. Physiatrist and rehab team continue to assess barriers to discharge/monitor patient progress toward functional and medical goals.  Function:  Bathing Bathing position   Position: Shower  Bathing parts Body parts bathed by patient: Right arm, Left arm, Chest, Abdomen, Front perineal area, Right upper leg, Left upper leg, Right lower leg, Back, Left lower leg, Buttocks Body parts bathed by helper: Buttocks, Left lower leg  Bathing assist Assist Level: More than reasonable time, Assistive device Assistive Device Comment: long handled sponge    Upper Body Dressing/Undressing Upper body dressing   What is the patient wearing?: Pull over shirt/dress     Pull over shirt/dress - Perfomed by patient: Thread/unthread right sleeve, Thread/unthread left sleeve, Put head through opening, Pull shirt over trunk          Upper body assist Assist Level: More than reasonable time   Set up : To obtain clothing/put away  Lower Body Dressing/Undressing Lower body dressing   What is the patient wearing?: Underwear, Pants, Non-skid slipper socks, Shoes Underwear - Performed by patient: Thread/unthread right underwear leg, Thread/unthread left underwear leg, Pull underwear up/down   Pants- Performed by patient: Thread/unthread right pants leg, Thread/unthread left pants leg, Pull pants up/down Pants- Performed by helper: Thread/unthread right pants leg Non-skid slipper socks- Performed by patient: Don/doff right sock, Don/doff left sock Non-skid slipper socks- Performed by helper: Don/doff right sock, Don/doff left sock Socks -  Performed by patient: Don/doff right sock Socks - Performed by helper: Don/doff left sock Shoes - Performed by patient: Don/doff left shoe, Fasten right, Fasten left, Don/doff right shoe Shoes - Performed by helper: Don/doff left shoe, Fasten left       TED Hose - Performed by helper: Don/doff left TED hose  Lower body assist Assist for  lower body dressing: More than reasonable time, Assistive device Assistive Device Comment: shoe funnel and step stool    Toileting Toileting Toileting activity did not occur: No continent bowel/bladder event Toileting steps completed by patient: Adjust clothing prior to toileting, Performs perineal hygiene, Adjust clothing after toileting      Toileting assist Assist level: More than reasonable time   Transfers Chair/bed transfer   Chair/bed transfer method: Ambulatory Chair/bed transfer assist level: No Help, no cues, assistive device, takes more than a reasonable amount of time Chair/bed transfer assistive device: Armrests, Patent attorney     Max distance: 100 Assist level: No help, No cues, assistive device, takes more than a reasonable amount of time   Wheelchair   Type: Manual Max wheelchair distance: 150 ft Assist Level: No help, No cues, assistive device, takes more than reasonable amount of time  Cognition Comprehension Comprehension assist level: Follows complex conversation/direction with no assist  Expression Expression assist level: Expresses complex ideas: With no assist  Social Interaction Social Interaction assist level: Interacts appropriately with others - No medications needed.  Problem Solving Problem solving assist level: Solves complex problems: Recognizes & self-corrects  Memory Memory assist level: Complete Independence: No helper    Medical Problem List and Plan: 1. Functional and mobility deficitssecondary to left SI joint diastases, pelvic ring fracture, left tib-fib fx. Pt s/p SI Screw, IMN to left tib fx, hematoma evacuation by Dr. Carola Frost on 09/17/16  D/c today  Will see patient in 1-2 weeks for transitional care management 2. DVT Prophylaxis/Anticoagulation: Pharmaceutical: Lovenox--4 weeks recommended by ortho (~5/11).   Vascular study neg for DVT 3. Pain Management:Better controlled and now weaned to tramadol qid with robaxin  prn  -cont ice, elevation of extremity.   May use heat prn around hematoma 4. Mood: team to provide ego support. LCSW to follow for evaluation and support for anxiety.  5. Neuropsych: This patient iscapable of making decisions on hisown behalf. 6. Skin/Wound Care: Monitor wound for healing. Routine pressure relief measures.  7. Fluids/Electrolytes/Nutrition: Offer snacks between meals.  8. Pelvic ring fracture s/p SI screw and comminuted left tibia Fx s/p IM nailing of left tibial shaft: TDWB LLE.  9. ABLA: improving post transfusion. Continue to monitor for signs of bleeding.   Hb 9.4 on 4/24  Cont to monitor 10. AKI: Resolved.  11. Thrombocytopenia: Resolved  12. Hyperglycemia: Likely stress induced.   Hgb A1C WNL.   Overall controlled 4/26 13. H/o anxiety disorder with depression: On Trintellix. Insomnia better with ativan.  14. Constipation: Augmented bowel program.   Improved with meds 15. Hypokalemia  K+ 4.7 on 4/24 16. Hypoalbuminemia  Supplement initiated 4/19 17. Leukocytosis: Resolved  WBCs 8.6 on 4/24  UA relatively unremarkable, Ucx negative  LOS (Days) 9 A FACE TO FACE EVALUATION WAS PERFORMED  Jadalynn Burr Karis Juba 10/03/2016 8:25 AM

## 2016-10-03 NOTE — Patient Care Conference (Signed)
Inpatient RehabilitationTeam Conference and Plan of Care Update Date: 10/02/2016   Time: 2:40 PM    Patient Name: Peter Morrow      Medical Record Number: [id number]  Date of Birth: May 27, 1981 Sex: Male         Room/Bed: 4M10C/4M10C-01 Payor Info: Payor: BLUE CROSS BLUE [employer] / Plan: [facility] HEALTH PPO / Product Type: *No Product type* /    Admitting Diagnosis: Trauma Polytramua  Admit Date/Time:  09/24/2016  7:39 PM Admission Comments: No comment available   Primary Diagnosis:  Trauma Principal Problem: Trauma  Patient Active Problem List   Diagnosis Date Noted  . Generalized anxiety disorder   . Slow transit constipation   . Abnormality of gait   . Hypoalbuminemia due to protein-calorie malnutrition (HCC)   . Hyperglycemia   . Displaced segmental fracture of shaft of left tibia, initial encounter for closed fracture 09/19/2016  . Blunt trauma   . Fracture   . Pelvic ring fracture (HCC)   . Trauma   . Depression   . Tachycardia   . Tachypnea   . Leukocytosis   . Acute blood loss anemia   . Post-operative pain   . Injury of pelvis 09/17/2016    Expected Discharge Date: Expected Discharge Date: 10/03/16  Team Members Present: Physician leading conference: Dr. Maryla Morrow Social Worker Present: Staci Acosta, LCSW Nurse Present: Carmie End, RN PT Present: Katherine Mantle, PT OT Present: Rosalio Loud, OT SLP Present: Feliberto Gottron, SLP PPS Coordinator present : Tora Duck, RN, CRRN     Current Status/Progress Goal Weekly Team Focus  Medical   Functional and mobility deficits secondary to left SI joint diastases, pelvic ring fracture, left tib-fib fx. Pt s/p SI Screw, IMN to left tib fx, hematoma evacuation by Dr. Carola Frost on 09/17/16  Improve mobility, transfers  See above   Bowel/Bladder   continent of bowel & bladder, LBM 10/01/16, has miralax daily  continue to be continent  continue to monitor for changes & assist as needed   Swallow/Nutrition/ Hydration           ADL's   supervision/setup  Mod I overall  ADL retraining, use of AE for LB dressing, pt/family education   Mobility   supervision/mod I gait, transfers, w/c  mod I gait and transfers, supervision stairs  family ed for stairs, d/c planning   Communication             Safety/Cognition/ Behavioral Observations            Pain   C/o pain scale 2, has tramadol, tylenol & robaxin prn. Takes the tramadol in the mornings, tylenol any other time  pain scale <3  continue to assess & treat as needed   Skin   incision sutures removed yesterday, ecchymosis resolving, no new areas of skin breakdown  no new areas of breakdown, no infection  continue to monitor q shift    Rehab Goals Patient on target to meet rehab goals: Yes Rehab Goals Revised: none *See Care Plan and progress notes for long and short-term goals.  Barriers to Discharge: Mobility, safety, transfers, hyperglycemia, anxiety    Possible Resolutions to Barriers:  Therapies, making gains, plans for d/c tomorrow    Discharge Planning/Teaching Needs:  Pt will return to his home with his wife and extended family to assist with his recovery and transition to home.  Pt with mod I goals and is independent to direct his family in his care.   Team Discussion:  Pt  is doing well medically.  His CBG is elevated, but A1C was normal.  Pt's questions have been answered.  RN asked if pt's miralax can be changed to once a day instead of twice a day.  Pt is mod I in the room after meeting goals and is ready to go.  He is supervision with stairs and father-in-law has been trained to do this with him.  Revisions to Treatment Plan:  none   Continued Need for Acute Rehabilitation Level of Care: The patient requires daily medical management by a physician with specialized training in physical medicine and rehabilitation for the following conditions: Daily direction of a multidisciplinary physical rehabilitation program to ensure safe treatment  while eliciting the highest outcome that is of practical value to the patient.: Yes Daily medical management of patient stability for increased activity during participation in an intensive rehabilitation regime.: Yes Daily analysis of laboratory values and/or radiology reports with any subsequent need for medication adjustment of medical intervention for : Post surgical problems;Other  Donzella Carrol, [place] 10/03/2016, 4:31 PM

## 2016-10-04 ENCOUNTER — Telehealth: Payer: Self-pay | Admitting: *Deleted

## 2016-10-04 NOTE — Telephone Encounter (Signed)
[facility] called for PT orders for 1wk1, 3wk2, 2wk2.  Approval given

## 2016-10-07 ENCOUNTER — Telehealth: Payer: Self-pay | Admitting: *Deleted

## 2016-10-07 NOTE — Telephone Encounter (Signed)
Transitional Care Call Completed, Appointment confirmed, new patient packet  Mailed Transitional Care Questions  Questions for our staff to ask patients on Transitional care 48 hour phone call:   1. Are you/is patient experiencing any problems since coming home? No  Are there any questions regarding any aspect of care? No  2. Are there any questions regarding medications administration/dosing? No  Are meds being taken as prescribed? Yes, for the most part, patient indicates not needing to take pain meds as often as script indicates  Patient should review meds with caller to confirm   3. Have there been any falls? No  4. Has Home Health been to the house and/or have they contacted you? Yes, Nursing visit completed, PT scheduled  If not, have you tried to contact them? Can we help you contact them?   5. Are bowels and bladder emptying properly? Yes Are there any unexpected incontinence issues?  no No If applicable, is patient following bowel/bladder programs?  6. Any fevers, problems with breathing, unexpected pain? No   7. Are there any skin problems or new areas of breakdown? No  8. Has the patient/family member arranged specialty MD follow up (ie cardiology/neurology/renal/surgical/etc)?  Yes, all taken care of Can we help arrange? No   9. Does the patient need any other services or support that we can help arrange? No  10. Are caregivers following through as expected in assisting the patient? Yes   11. Has the patient quit smoking, drinking alcohol, or using drugs as recommended? No smoke, no drink, no illicit drugs

## 2016-10-09 ENCOUNTER — Encounter: Payer: Self-pay | Admitting: Physical Medicine & Rehabilitation

## 2016-10-09 ENCOUNTER — Encounter
Payer: Worker's Compensation | Attending: Physical Medicine & Rehabilitation | Admitting: Physical Medicine & Rehabilitation

## 2016-10-09 VITALS — BP 128/71 | HR 132 | Resp 14

## 2016-10-09 DIAGNOSIS — G8918 Other acute postprocedural pain: Secondary | ICD-10-CM | POA: Diagnosis not present

## 2016-10-09 DIAGNOSIS — Z87891 Personal history of nicotine dependence: Secondary | ICD-10-CM | POA: Diagnosis not present

## 2016-10-09 DIAGNOSIS — S82262A Displaced segmental fracture of shaft of left tibia, initial encounter for closed fracture: Secondary | ICD-10-CM | POA: Diagnosis not present

## 2016-10-09 DIAGNOSIS — R000 Tachycardia, unspecified: Secondary | ICD-10-CM

## 2016-10-09 DIAGNOSIS — F411 Generalized anxiety disorder: Secondary | ICD-10-CM | POA: Diagnosis not present

## 2016-10-09 DIAGNOSIS — W208XXD Other cause of strike by thrown, projected or falling object, subsequent encounter: Secondary | ICD-10-CM | POA: Diagnosis not present

## 2016-10-09 DIAGNOSIS — F329 Major depressive disorder, single episode, unspecified: Secondary | ICD-10-CM

## 2016-10-09 DIAGNOSIS — S3993XD Unspecified injury of pelvis, subsequent encounter: Secondary | ICD-10-CM | POA: Diagnosis not present

## 2016-10-09 DIAGNOSIS — R269 Unspecified abnormalities of gait and mobility: Secondary | ICD-10-CM | POA: Diagnosis not present

## 2016-10-09 DIAGNOSIS — S3289XD Fracture of other parts of pelvis, subsequent encounter for fracture with routine healing: Secondary | ICD-10-CM | POA: Insufficient documentation

## 2016-10-09 DIAGNOSIS — F418 Other specified anxiety disorders: Secondary | ICD-10-CM | POA: Diagnosis not present

## 2016-10-09 DIAGNOSIS — D62 Acute posthemorrhagic anemia: Secondary | ICD-10-CM
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
## 2016-10-10 ENCOUNTER — Encounter: Payer: Worker's Compensation | Admitting: Physical Medicine & Rehabilitation

## 2016-10-16 DIAGNOSIS — Z9181 History of falling: Secondary | ICD-10-CM

## 2016-10-16 DIAGNOSIS — S3289XD Fracture of other parts of pelvis, subsequent encounter for fracture with routine healing: Secondary | ICD-10-CM

## 2016-10-16 DIAGNOSIS — F419 Anxiety disorder, unspecified: Secondary | ICD-10-CM | POA: Diagnosis not present

## 2016-10-16 DIAGNOSIS — F329 Major depressive disorder, single episode, unspecified: Secondary | ICD-10-CM | POA: Diagnosis not present

## 2016-10-16 DIAGNOSIS — S82262D Displaced segmental fracture of shaft of left tibia, subsequent encounter for closed fracture with routine healing: Secondary | ICD-10-CM

## 2016-10-16 DIAGNOSIS — D649 Anemia, unspecified: Secondary | ICD-10-CM

## 2016-10-16 DIAGNOSIS — S301XXD Contusion of abdominal wall, subsequent encounter: Secondary | ICD-10-CM | POA: Diagnosis not present

## 2016-10-17 ENCOUNTER — Encounter: Payer: Self-pay | Admitting: Physical Medicine & Rehabilitation

## 2016-12-12 ENCOUNTER — Encounter: Payer: Self-pay | Admitting: Physical Medicine & Rehabilitation

## 2016-12-12 ENCOUNTER — Encounter
Payer: Worker's Compensation | Attending: Physical Medicine & Rehabilitation | Admitting: Physical Medicine & Rehabilitation

## 2016-12-12 VITALS — BP 134/83 | HR 118 | Temp 99.0°F | Resp 118

## 2016-12-12 DIAGNOSIS — S82222D Displaced transverse fracture of shaft of left tibia, subsequent encounter for closed fracture with routine healing: Secondary | ICD-10-CM | POA: Insufficient documentation

## 2016-12-12 DIAGNOSIS — F418 Other specified anxiety disorders: Secondary | ICD-10-CM | POA: Diagnosis not present

## 2016-12-12 DIAGNOSIS — R109 Unspecified abdominal pain: Secondary | ICD-10-CM

## 2016-12-12 DIAGNOSIS — S32048D Other fracture of fourth lumbar vertebra, subsequent encounter for fracture with routine healing: Secondary | ICD-10-CM | POA: Insufficient documentation

## 2016-12-12 DIAGNOSIS — T1490XA Injury, unspecified, initial encounter: Secondary | ICD-10-CM | POA: Diagnosis not present

## 2016-12-12 DIAGNOSIS — F411 Generalized anxiety disorder: Secondary | ICD-10-CM | POA: Diagnosis not present

## 2016-12-12 DIAGNOSIS — S32038D Other fracture of third lumbar vertebra, subsequent encounter for fracture with routine healing: Secondary | ICD-10-CM | POA: Diagnosis not present

## 2016-12-12 DIAGNOSIS — S32028D Other fracture of second lumbar vertebra, subsequent encounter for fracture with routine healing: Secondary | ICD-10-CM | POA: Insufficient documentation

## 2016-12-12 DIAGNOSIS — L539 Erythematous condition, unspecified: Secondary | ICD-10-CM | POA: Insufficient documentation

## 2016-12-12 DIAGNOSIS — R000 Tachycardia, unspecified: Secondary | ICD-10-CM | POA: Insufficient documentation

## 2016-12-12 DIAGNOSIS — S82422D Displaced transverse fracture of shaft of left fibula, subsequent encounter for closed fracture with routine healing: Secondary | ICD-10-CM | POA: Diagnosis not present

## 2016-12-12 DIAGNOSIS — W208XXD Other cause of strike by thrown, projected or falling object, subsequent encounter: Secondary | ICD-10-CM | POA: Diagnosis not present

## 2016-12-12 DIAGNOSIS — R269 Unspecified abnormalities of gait and mobility: Secondary | ICD-10-CM | POA: Diagnosis present

## 2016-12-12 DIAGNOSIS — S32058D Other fracture of fifth lumbar vertebra, subsequent encounter for fracture with routine healing: Secondary | ICD-10-CM | POA: Insufficient documentation

## 2016-12-12 DIAGNOSIS — R600 Localized edema: Secondary | ICD-10-CM | POA: Insufficient documentation

## 2016-12-12 DIAGNOSIS — S32810D Multiple fractures of pelvis with stable disruption of pelvic ring, subsequent encounter for fracture with routine healing: Secondary | ICD-10-CM | POA: Diagnosis not present
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
## 2017-02-12 ENCOUNTER — Encounter: Payer: Self-pay | Admitting: Physical Medicine & Rehabilitation

## 2017-02-12 ENCOUNTER — Encounter
Payer: Worker's Compensation | Attending: Physical Medicine & Rehabilitation | Admitting: Physical Medicine & Rehabilitation

## 2017-02-12 VITALS — BP 126/88 | HR 77

## 2017-02-12 DIAGNOSIS — F329 Major depressive disorder, single episode, unspecified: Secondary | ICD-10-CM

## 2017-02-12 DIAGNOSIS — R269 Unspecified abnormalities of gait and mobility: Secondary | ICD-10-CM | POA: Diagnosis not present

## 2017-02-12 DIAGNOSIS — F411 Generalized anxiety disorder: Secondary | ICD-10-CM

## 2017-02-12 DIAGNOSIS — S3289XD Fracture of other parts of pelvis, subsequent encounter for fracture with routine healing: Secondary | ICD-10-CM | POA: Insufficient documentation

## 2017-02-12 DIAGNOSIS — W208XXD Other cause of strike by thrown, projected or falling object, subsequent encounter: Secondary | ICD-10-CM | POA: Insufficient documentation

## 2017-02-12 DIAGNOSIS — Z79899 Other long term (current) drug therapy: Secondary | ICD-10-CM | POA: Diagnosis not present

## 2017-02-12 DIAGNOSIS — S82202D Unspecified fracture of shaft of left tibia, subsequent encounter for closed fracture with routine healing: Secondary | ICD-10-CM | POA: Diagnosis not present

## 2017-02-12 DIAGNOSIS — T1490XA Injury, unspecified, initial encounter: Secondary | ICD-10-CM | POA: Diagnosis not present

## 2017-02-12 DIAGNOSIS — Z87891 Personal history of nicotine dependence: Secondary | ICD-10-CM | POA: Diagnosis not present

## 2017-02-12 DIAGNOSIS — S82402D Unspecified fracture of shaft of left fibula, subsequent encounter for closed fracture with routine healing: Secondary | ICD-10-CM | POA: Insufficient documentation

## 2017-02-12 DIAGNOSIS — F418 Other specified anxiety disorders: Secondary | ICD-10-CM | POA: Diagnosis not present
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
## 2017-08-11 NOTE — Pre-Procedure Instructions (Signed)
Ebony HailMichael Muckleroy  08/11/2017      [id number] - [place], [place] - [address] [place] [id number] Phone: [phone] Fax: [phone]    Your procedure is scheduled on August 14, 2017.  [facility] at [facility] AM.  Call this number if you have problems the morning of surgery:  [phone]   Remember:  Do not eat food or drink liquids after midnight.  Take these medicines the morning of surgery with A SIP OF WATER: trintellix.  Beginning now, STOP taking any Aspirin (unless otherwise instructed by your surgeon), Aleve, Naproxen, Ibuprofen, Motrin, Advil, Goody's, BC's, all herbal medications, fish oil, and all vitamins  Continue all other medications as instructed by your physician except follow the above medication instructions before surgery   Do not wear jewelry.  Do not wear lotions, powders, or colognes, or deodorant.  Men may shave face and neck.  Do not bring valuables to the hospital.  [facility] is not responsible for any belongings or valuables.  Contacts, dentures or bridgework may not be worn into surgery.  Leave your suitcase in the car.  After surgery it may be brought to your room.  For patients admitted to the hospital, discharge time will be determined by your treatment team.  Patients discharged the day of surgery will not be allowed to drive home.   Special instructions:   [place]- Preparing For Surgery  Before surgery, you can play an important role. Because skin is not sterile, your skin needs to be as free of germs as possible. You can reduce the number of germs on your skin by washing with CHG (chlorahexidine gluconate) Soap before surgery.  CHG is an antiseptic cleaner which kills germs and bonds with the skin to continue killing germs even after washing.  Please do not use if you have an allergy to CHG or antibacterial soaps. If your skin becomes reddened/irritated stop  using the CHG.  Do not shave (including legs and underarms) for at least 48 hours prior to first CHG shower. It is OK to shave your face.  Please follow these instructions carefully.   1. Shower the NIGHT BEFORE SURGERY and the MORNING OF SURGERY with CHG.   2. If you chose to wash your hair, wash your hair first as usual with your normal shampoo.  3. After you shampoo, rinse your hair and body thoroughly to remove the shampoo.  4. Use CHG as you would any other liquid soap. You can apply CHG directly to the skin and wash gently with a scrungie or a clean washcloth.   5. Apply the CHG Soap to your body ONLY FROM THE NECK DOWN.  Do not use on open wounds or open sores. Avoid contact with your eyes, ears, mouth and genitals (private parts). Wash Face and genitals (private parts)  with your normal soap.  6. Wash thoroughly, paying special attention to the area where your surgery will be performed.  7. Thoroughly rinse your body with warm water from the neck down.  8. DO NOT shower/wash with your normal soap after using and rinsing off the CHG Soap.  9. Pat yourself dry with a CLEAN TOWEL.  10. Wear CLEAN PAJAMAS to bed the night before surgery, wear comfortable clothes the morning of surgery  11. Place CLEAN SHEETS on your bed the night of your first shower and DO NOT SLEEP WITH PETS.   Day  of Surgery: Do not apply any deodorants/lotions. Please wear clean clothes to the hospital/surgery center.    Please read over the following fact sheets that you were given. Pain Booklet, Coughing and Deep Breathing and Surgical Site Infection Prevention

## 2017-08-12 ENCOUNTER — Encounter (HOSPITAL_COMMUNITY): Payer: Self-pay

## 2017-08-12 ENCOUNTER — Other Ambulatory Visit: Payer: Self-pay

## 2017-08-12 ENCOUNTER — Encounter (HOSPITAL_COMMUNITY)
Admission: RE | Admit: 2017-08-12 | Discharge: 2017-08-12 | Disposition: A | Discharge status: Home / Self Care | Payer: No Typology Code available for payment source | Source: Ambulatory Visit | Attending: Orthopedic Surgery | Admitting: Orthopedic Surgery

## 2017-08-12 DIAGNOSIS — F329 Major depressive disorder, single episode, unspecified: Secondary | ICD-10-CM | POA: Diagnosis not present

## 2017-08-12 DIAGNOSIS — T8484XA Pain due to internal orthopedic prosthetic devices, implants and grafts, initial encounter: Secondary | ICD-10-CM | POA: Diagnosis present

## 2017-08-12 DIAGNOSIS — F419 Anxiety disorder, unspecified: Secondary | ICD-10-CM | POA: Diagnosis not present

## 2017-08-12 DIAGNOSIS — Z87891 Personal history of nicotine dependence: Secondary | ICD-10-CM | POA: Diagnosis not present

## 2017-08-12 DIAGNOSIS — Z79899 Other long term (current) drug therapy: Secondary | ICD-10-CM | POA: Diagnosis not present

## 2017-08-12 DIAGNOSIS — Z01812 Encounter for preprocedural laboratory examination: Secondary | ICD-10-CM | POA: Diagnosis not present

## 2017-08-12 DIAGNOSIS — G8928 Other chronic postprocedural pain: Secondary | ICD-10-CM | POA: Diagnosis not present

## 2017-08-12 LAB — CBC
HCT: 47.4 % (ref 39.0–52.0)
Hemoglobin: 16 g/dL (ref 13.0–17.0)
MCH: 28.8 pg (ref 26.0–34.0)
MCHC: 33.8 g/dL (ref 30.0–36.0)
MCV: 85.3 fL (ref 78.0–100.0)
PLATELETS: 222 10*3/uL (ref 150–400)
RBC: 5.56 MIL/uL (ref 4.22–5.81)
RDW: 13.1 % (ref 11.5–15.5)
WBC: 7.3 10*3/uL (ref 4.0–10.5)

## 2017-08-13 NOTE — Anesthesia Preprocedure Evaluation (Addendum)
Anesthesia Evaluation  Patient identified by MRN, date of birth, ID band Patient awake    Reviewed: Allergy & Precautions, NPO status , Patient's Chart, lab work & pertinent test results  History of Anesthesia Complications Negative for: history of anesthetic complications  Airway Mallampati: II  TM Distance: >3 FB Neck ROM: Full    Dental  (+) Dental Advisory Given, Teeth Intact   Pulmonary former smoker,    Pulmonary exam normal breath sounds clear to auscultation       Cardiovascular negative cardio ROS   Rhythm:Regular Rate:Tachycardia     Neuro/Psych negative neurological ROS  negative psych ROS   GI/Hepatic negative GI ROS, Neg liver ROS,   Endo/Other  negative endocrine ROS  Renal/GU negative Renal ROS     Musculoskeletal   Abdominal   Peds  Hematology negative hematology ROS (+)   Anesthesia Other Findings   Reproductive/Obstetrics                            Lab Results  Component Value Date   WBC 7.3 08/12/2017   HGB 16.0 08/12/2017   HCT 47.4 08/12/2017   MCV 85.3 08/12/2017   PLT 222 08/12/2017    Anesthesia Physical  Anesthesia Plan  ASA: I  Anesthesia Plan: General   Post-op Pain Management:    Induction: Intravenous  PONV Risk Score and Plan: 1 and Treatment may vary due to age or medical condition and Ondansetron  Airway Management Planned: Oral ETT  Additional Equipment:   Intra-op Plan:   Post-operative Plan: Possible Post-op intubation/ventilation and Extubation in OR  Informed Consent: I have reviewed the patients History and Physical, chart, labs and discussed the procedure including the risks, benefits and alternatives for the proposed anesthesia with the patient or authorized representative who has indicated his/her understanding and acceptance.   Dental advisory given  Plan Discussed with: Surgeon, CRNA and Anesthesiologist  Anesthesia  Plan Comments: (Large bore PIV vs central line depending on stability during induction and case. Possible A line)       Anesthesia Quick Evaluation

## 2017-08-13 NOTE — H&P (Signed)
[facility]   Chief Complaint:  Symptomatic hardware L tibia and L SI joint HPI:   Patient is a 37 year old white male well-known to the orthopedic trauma service after being injured when he was struck by a tree.  Patient had numerous injuries including a left tibia fracture and a pelvic ring disruption.  Patient had a surgical intervention to address these fractures.  Patient has recovered quite nicely however he continues to have symptoms consistent with symptomatic hardware.  Patient presents today for removal of left tibial nail as well as left SI screw.  Patient is essentially back to full activities he has been given several rounds of therapy ago to see if this would address his pain however he continues to have discomfort.  No other issues noted.  Past Medical History:  Diagnosis Date  . Anxiety disorder    with history of panic attacks  . Depression   . Displaced segmental fracture of shaft of left tibia, initial encounter for closed fracture 09/19/2016  . Distal radius fracture--right 2017    Past Surgical History:  Procedure Laterality Date  . SACRO-ILIAC PINNING Left 09/17/2016   Procedure: INSERTION LEFT SACRO-ILIAC SCREW AND EVACUATION HEMATOMA LEFT HIP;  Surgeon: Myrene Galas, MD;  Location: [facility] OR;  Service: Orthopedics;  Laterality: Left;  . TIBIA IM NAIL INSERTION Left 09/17/2016   Procedure: INTRAMEDULLARY (IM) NAIL TIBIAL;  Surgeon: Myrene Galas, MD;  Location: [facility] OR;  Service: Orthopedics;  Laterality: Left;    Family History  Problem Relation Age of Onset  . Healthy Mother        history of GBS  . Healthy Father   . Other Brother        cystic mass on brain  . Scleroderma Maternal Grandmother   . Prostate cancer Maternal Grandfather    Social History:  reports that he has quit smoking. He has quit using smokeless tobacco. He reports that he drinks alcohol. He reports that he does not use drugs.  Allergies: No Known Allergies   Current Meds   Medication Sig  . cholecalciferol (VITAMIN D) 1000 units tablet Take 1,000 Units by mouth daily.  Marland Kitchen vortioxetine HBr (TRINTELLIX) 10 MG TABS Take 10 mg by mouth daily.    Results for orders placed or performed during the hospital encounter of 08/12/17 (from the past 48 hour(s))  CBC     Status: None   Collection Time: 08/12/17  8:15 AM  Result Value Ref Range   WBC 7.3 4.0 - 10.5 K/uL   RBC 5.56 4.22 - 5.81 MIL/uL   Hemoglobin 16.0 13.0 - 17.0 g/dL   HCT 16.1 09.6 - 04.5 %   MCV 85.3 78.0 - 100.0 fL   MCH 28.8 26.0 - 34.0 pg   MCHC 33.8 30.0 - 36.0 g/dL   RDW 40.9 81.1 - 91.4 %   Platelets 222 150 - 400 K/uL    Comment: Performed at [facility], [address], [place], [place] [address]   No results found.  Review of Systems  Constitutional: Negative for chills and fever.  Respiratory: Negative for shortness of breath and wheezing.   Cardiovascular: Negative for chest pain and palpitations.  Gastrointestinal: Negative for nausea and vomiting.  Genitourinary: Negative for dysuria.  Neurological: Negative for tingling.   Vital signs on arrival to short stay  Physical Exam  Constitutional: He is oriented to person, place, and time. He appears well-developed and well-nourished. No distress.  Cardiovascular: Normal rate and regular rhythm.  Respiratory: Effort normal and breath sounds normal. No respiratory distress.  GI: Soft. Bowel sounds are normal.  Musculoskeletal:  Pelvis and low back    Significant paraspinal spasms    Mild tenderness along his left posterior SI/lumbosacral region but it is mild  Left lower extremity No significant swelling to the left leg All surgical wounds are well-healed No knee or ankle effusion Excellent range of motion of left knee and ankle Extremity is warm  + DP pulse Mild tenderness with palpation along the tibial locking bolts proximally minimal tenderness over the distal locking bolts Distal motor and sensory functions are  grossly intact Nontender over fracture site  Neurological: He is alert and oriented to person, place, and time.  Skin: Skin is warm and dry.     Assessment/Plan 37 year old male status post IM nailing left tibia and left SI screw for posterior pelvic ring instability with symptomatic hardware  -Symptomatic hardware left tibia and pelvis  OR for removal of hardware from left tibia and pelvis  Weight-bear as tolerated postoperatively  No range of motion restrictions  Outpatient procedure  Risks and benefits reviewed with the patient he wishes to proceed    Mearl LatinPAUL,Daegon Deiss W, PA-C 08/13/2017, 11:01 PM

## 2017-08-14 ENCOUNTER — Other Ambulatory Visit: Payer: Self-pay

## 2017-08-14 ENCOUNTER — Ambulatory Visit (HOSPITAL_COMMUNITY): Payer: No Typology Code available for payment source | Admitting: Anesthesiology

## 2017-08-14 ENCOUNTER — Encounter (HOSPITAL_COMMUNITY)
Admission: RE | Disposition: A | Discharge status: Home / Self Care | Payer: Self-pay | Source: Ambulatory Visit | Attending: Orthopedic Surgery

## 2017-08-14 ENCOUNTER — Ambulatory Visit (HOSPITAL_COMMUNITY)
Admission: RE | Admit: 2017-08-14 | Discharge: 2017-08-14 | Disposition: A | Discharge status: Home / Self Care | Payer: No Typology Code available for payment source | Source: Ambulatory Visit | Attending: Orthopedic Surgery | Admitting: Orthopedic Surgery

## 2017-08-14 ENCOUNTER — Ambulatory Visit (HOSPITAL_COMMUNITY): Payer: No Typology Code available for payment source

## 2017-08-14 ENCOUNTER — Encounter (HOSPITAL_COMMUNITY): Payer: Self-pay | Admitting: General Practice

## 2017-08-14 DIAGNOSIS — G8928 Other chronic postprocedural pain: Secondary | ICD-10-CM | POA: Insufficient documentation

## 2017-08-14 DIAGNOSIS — F419 Anxiety disorder, unspecified: Secondary | ICD-10-CM | POA: Insufficient documentation

## 2017-08-14 DIAGNOSIS — Z87891 Personal history of nicotine dependence: Secondary | ICD-10-CM | POA: Insufficient documentation

## 2017-08-14 DIAGNOSIS — F329 Major depressive disorder, single episode, unspecified: Secondary | ICD-10-CM | POA: Insufficient documentation

## 2017-08-14 DIAGNOSIS — Z79899 Other long term (current) drug therapy: Secondary | ICD-10-CM | POA: Insufficient documentation

## 2017-08-14 DIAGNOSIS — Z419 Encounter for procedure for purposes other than remedying health state, unspecified: Secondary | ICD-10-CM

## 2017-08-14 DIAGNOSIS — Z01812 Encounter for preprocedural laboratory examination: Secondary | ICD-10-CM | POA: Insufficient documentation

## 2017-08-14 DIAGNOSIS — T8484XA Pain due to internal orthopedic prosthetic devices, implants and grafts, initial encounter: Secondary | ICD-10-CM | POA: Diagnosis not present

## 2017-08-14 HISTORY — PX: HARDWARE REMOVAL: SHX979

## 2017-08-14 SURGERY — REMOVAL, HARDWARE
Anesthesia: General | Site: Leg Upper | Laterality: Left

## 2017-08-14 MED ORDER — FENTANYL CITRATE (PF) 250 MCG/5ML IJ SOLN
INTRAMUSCULAR | Status: AC
  Filled 2017-08-14: qty 5

## 2017-08-14 MED ORDER — KETOROLAC TROMETHAMINE 30 MG/ML IJ SOLN
INTRAMUSCULAR | Status: DC | PRN
  Administered 2017-08-14: 30 mg via INTRAVENOUS

## 2017-08-14 MED ORDER — ONDANSETRON HCL 4 MG/2ML IJ SOLN
INTRAMUSCULAR | Status: AC
  Filled 2017-08-14: qty 2

## 2017-08-14 MED ORDER — PHENYLEPHRINE HCL 10 MG/ML IJ SOLN
INTRAMUSCULAR | Status: DC | PRN
  Administered 2017-08-14: 40 ug via INTRAVENOUS
  Administered 2017-08-14 (×2): 80 ug via INTRAVENOUS

## 2017-08-14 MED ORDER — KETOROLAC TROMETHAMINE 10 MG PO TABS: 10.0000 mg | ORAL_TABLET | Freq: Four times a day (QID) | ORAL | 0 refills | Status: DC | PRN

## 2017-08-14 MED ORDER — FENTANYL CITRATE (PF) 100 MCG/2ML IJ SOLN
INTRAMUSCULAR | Status: DC | PRN
  Administered 2017-08-14: 50 ug via INTRAVENOUS
  Administered 2017-08-14: 150 ug via INTRAVENOUS
  Administered 2017-08-14: 50 ug via INTRAVENOUS

## 2017-08-14 MED ORDER — MIDAZOLAM HCL 5 MG/5ML IJ SOLN
INTRAMUSCULAR | Status: DC | PRN
  Administered 2017-08-14: 2 mg via INTRAVENOUS

## 2017-08-14 MED ORDER — CEFAZOLIN SODIUM-DEXTROSE 2-4 GM/100ML-% IV SOLN
INTRAVENOUS | Status: AC
  Filled 2017-08-14: qty 100

## 2017-08-14 MED ORDER — ACETAMINOPHEN 10 MG/ML IV SOLN
1000.0000 mg | Freq: Once | INTRAVENOUS | Status: AC
  Administered 2017-08-14: 1000 mg via INTRAVENOUS

## 2017-08-14 MED ORDER — ACETAMINOPHEN 10 MG/ML IV SOLN
INTRAVENOUS | Status: AC
  Administered 2017-08-14: 1000 mg via INTRAVENOUS
  Filled 2017-08-14: qty 100

## 2017-08-14 MED ORDER — MEPERIDINE HCL 50 MG/ML IJ SOLN: 6.2500 mg | INTRAMUSCULAR | Status: DC | PRN

## 2017-08-14 MED ORDER — KETOROLAC TROMETHAMINE 30 MG/ML IJ SOLN
INTRAMUSCULAR | Status: AC
  Filled 2017-08-14: qty 1

## 2017-08-14 MED ORDER — LIDOCAINE HCL (CARDIAC) 20 MG/ML IV SOLN
INTRAVENOUS | Status: DC | PRN
  Administered 2017-08-14: 100 mg via INTRAVENOUS

## 2017-08-14 MED ORDER — CEFAZOLIN SODIUM-DEXTROSE 2-4 GM/100ML-% IV SOLN
2.0000 g | INTRAVENOUS | Status: AC
  Administered 2017-08-14: 2 g via INTRAVENOUS

## 2017-08-14 MED ORDER — TRAMADOL HCL 50 MG PO TABS: 50.0000 mg | ORAL_TABLET | Freq: Two times a day (BID) | ORAL | 0 refills | Status: DC | PRN

## 2017-08-14 MED ORDER — CHLORHEXIDINE GLUCONATE 4 % EX LIQD: 60.0000 mL | Freq: Once | CUTANEOUS | Status: DC

## 2017-08-14 MED ORDER — ROCURONIUM BROMIDE 10 MG/ML (PF) SYRINGE
PREFILLED_SYRINGE | INTRAVENOUS | Status: AC
  Filled 2017-08-14: qty 5

## 2017-08-14 MED ORDER — MIDAZOLAM HCL 2 MG/2ML IJ SOLN
INTRAMUSCULAR | Status: AC
  Filled 2017-08-14: qty 2

## 2017-08-14 MED ORDER — PROMETHAZINE HCL 25 MG/ML IJ SOLN: 6.2500 mg | INTRAMUSCULAR | Status: DC | PRN

## 2017-08-14 MED ORDER — ACETAMINOPHEN 500 MG PO TABS
ORAL_TABLET | ORAL | Status: AC
  Administered 2017-08-14: 1000 mg via ORAL
  Filled 2017-08-14: qty 2

## 2017-08-14 MED ORDER — PROPOFOL 10 MG/ML IV BOLUS
INTRAVENOUS | Status: AC
  Filled 2017-08-14: qty 20

## 2017-08-14 MED ORDER — ACETAMINOPHEN 10 MG/ML IV SOLN: 1000.0000 mg | Freq: Once | INTRAVENOUS | Status: DC | PRN

## 2017-08-14 MED ORDER — ACETAMINOPHEN 500 MG PO TABS
1000.0000 mg | ORAL_TABLET | Freq: Once | ORAL | Status: AC
  Administered 2017-08-14: 1000 mg via ORAL

## 2017-08-14 MED ORDER — HYDROCODONE-ACETAMINOPHEN 7.5-325 MG PO TABS: 1.0000 | ORAL_TABLET | Freq: Once | ORAL | Status: DC | PRN

## 2017-08-14 MED ORDER — GABAPENTIN 300 MG PO CAPS
ORAL_CAPSULE | ORAL | Status: AC
  Administered 2017-08-14: 300 mg via ORAL
  Filled 2017-08-14: qty 1

## 2017-08-14 MED ORDER — TRAMADOL HCL 50 MG PO TABS
50.0000 mg | ORAL_TABLET | Freq: Once | ORAL | Status: AC
  Administered 2017-08-14: 50 mg via ORAL

## 2017-08-14 MED ORDER — HYDROMORPHONE HCL 1 MG/ML IJ SOLN
0.2500 mg | INTRAMUSCULAR | Status: DC | PRN
  Administered 2017-08-14: 0.5 mg via INTRAVENOUS

## 2017-08-14 MED ORDER — ONDANSETRON HCL 4 MG/2ML IJ SOLN
INTRAMUSCULAR | Status: DC | PRN
  Administered 2017-08-14: 4 mg via INTRAVENOUS

## 2017-08-14 MED ORDER — CLONIDINE HCL 0.2 MG PO TABS
ORAL_TABLET | ORAL | Status: AC
  Administered 2017-08-14: 0.2 mg via ORAL
  Filled 2017-08-14: qty 1

## 2017-08-14 MED ORDER — LACTATED RINGERS IV SOLN
INTRAVENOUS | Status: DC
  Administered 2017-08-14 (×2): via INTRAVENOUS

## 2017-08-14 MED ORDER — ROCURONIUM BROMIDE 100 MG/10ML IV SOLN
INTRAVENOUS | Status: DC | PRN
  Administered 2017-08-14: 60 mg via INTRAVENOUS
  Administered 2017-08-14: 20 mg via INTRAVENOUS

## 2017-08-14 MED ORDER — GABAPENTIN 300 MG PO CAPS
300.0000 mg | ORAL_CAPSULE | Freq: Once | ORAL | Status: AC
  Administered 2017-08-14: 300 mg via ORAL

## 2017-08-14 MED ORDER — SUGAMMADEX SODIUM 200 MG/2ML IV SOLN
INTRAVENOUS | Status: DC | PRN
  Administered 2017-08-14: 200 mg via INTRAVENOUS

## 2017-08-14 MED ORDER — HYDROMORPHONE HCL 1 MG/ML IJ SOLN
INTRAMUSCULAR | Status: AC
  Filled 2017-08-14: qty 1

## 2017-08-14 MED ORDER — KETOROLAC TROMETHAMINE 30 MG/ML IJ SOLN: 30.0000 mg | Freq: Once | INTRAMUSCULAR | Status: DC

## 2017-08-14 MED ORDER — DEXAMETHASONE SODIUM PHOSPHATE 10 MG/ML IJ SOLN
INTRAMUSCULAR | Status: AC
  Filled 2017-08-14: qty 1

## 2017-08-14 MED ORDER — 0.9 % SODIUM CHLORIDE (POUR BTL) OPTIME
TOPICAL | Status: DC | PRN
  Administered 2017-08-14: 1000 mL

## 2017-08-14 MED ORDER — PROPOFOL 10 MG/ML IV BOLUS
INTRAVENOUS | Status: DC | PRN
  Administered 2017-08-14: 200 mg via INTRAVENOUS

## 2017-08-14 MED ORDER — TRAMADOL HCL 50 MG PO TABS
ORAL_TABLET | ORAL | Status: AC
  Filled 2017-08-14: qty 1

## 2017-08-14 MED ORDER — CLONIDINE HCL 0.2 MG PO TABS
0.2000 mg | ORAL_TABLET | Freq: Once | ORAL | Status: AC
  Administered 2017-08-14: 0.2 mg via ORAL

## 2017-08-14 MED ORDER — SUCCINYLCHOLINE CHLORIDE 20 MG/ML IJ SOLN
INTRAMUSCULAR | Status: AC
  Filled 2017-08-14: qty 1

## 2017-08-14 MED ORDER — ACETAMINOPHEN 500 MG PO TABS: 500.0000 mg | ORAL_TABLET | Freq: Three times a day (TID) | ORAL | 0 refills | Status: DC | PRN

## 2017-08-14 MED ORDER — ONDANSETRON 4 MG PO TBDP: 4.0000 mg | ORAL_TABLET | Freq: Three times a day (TID) | ORAL | 0 refills | Status: DC | PRN

## 2017-08-14 MED ORDER — SUGAMMADEX SODIUM 200 MG/2ML IV SOLN
INTRAVENOUS | Status: AC
  Filled 2017-08-14: qty 2

## 2017-08-14 MED ORDER — DEXAMETHASONE SODIUM PHOSPHATE 10 MG/ML IJ SOLN
INTRAMUSCULAR | Status: DC | PRN
  Administered 2017-08-14: 10 mg via INTRAVENOUS

## 2017-08-14 SURGICAL SUPPLY — 55 items
BANDAGE ACE 4X5 VEL STRL LF (GAUZE/BANDAGES/DRESSINGS) IMPLANT
BANDAGE ACE 6X5 VEL STRL LF (GAUZE/BANDAGES/DRESSINGS) IMPLANT
BANDAGE ESMARK 6X9 LF (GAUZE/BANDAGES/DRESSINGS) ×1 IMPLANT
BNDG COHESIVE 6X5 TAN STRL LF (GAUZE/BANDAGES/DRESSINGS) IMPLANT
BNDG ELASTIC 6X15 VLCR STRL LF (GAUZE/BANDAGES/DRESSINGS) ×3 IMPLANT
BNDG ESMARK 6X9 LF (GAUZE/BANDAGES/DRESSINGS) ×3
BNDG GAUZE ELAST 4 BULKY (GAUZE/BANDAGES/DRESSINGS) ×3 IMPLANT
BRUSH SCRUB SURG 4.25 DISP (MISCELLANEOUS) ×6 IMPLANT
CLOSURE WOUND 1/2 X4 (GAUZE/BANDAGES/DRESSINGS)
COVER SURGICAL LIGHT HANDLE (MISCELLANEOUS) ×3 IMPLANT
DRAPE C-ARM 42X72 X-RAY (DRAPES) IMPLANT
DRAPE C-ARMOR (DRAPES) ×3 IMPLANT
DRAPE INCISE IOBAN 66X45 STRL (DRAPES) ×3 IMPLANT
DRAPE ORTHO SPLIT 77X108 STRL (DRAPES) ×2
DRAPE SURG ORHT 6 SPLT 77X108 (DRAPES) ×1 IMPLANT
DRAPE U-SHAPE 47X51 STRL (DRAPES) ×3 IMPLANT
DRSG ADAPTIC 3X8 NADH LF (GAUZE/BANDAGES/DRESSINGS) ×3 IMPLANT
ELECT REM PT RETURN 9FT ADLT (ELECTROSURGICAL) ×3
ELECTRODE REM PT RTRN 9FT ADLT (ELECTROSURGICAL) ×1 IMPLANT
GAUZE SPONGE 4X4 12PLY STRL (GAUZE/BANDAGES/DRESSINGS) ×3 IMPLANT
GLOVE BIO SURGEON STRL SZ8 (GLOVE) ×6 IMPLANT
GLOVE BIOGEL PI IND STRL 6.5 (GLOVE) ×1 IMPLANT
GLOVE BIOGEL PI IND STRL 8 (GLOVE) ×1 IMPLANT
GLOVE BIOGEL PI INDICATOR 6.5 (GLOVE) ×2
GLOVE BIOGEL PI INDICATOR 8 (GLOVE) ×2
GLOVE SURG SS PI 6.5 STRL IVOR (GLOVE) ×3 IMPLANT
GLOVE SURG SS PI 7.0 STRL IVOR (GLOVE) ×3 IMPLANT
GOWN STRL REUS W/ TWL LRG LVL3 (GOWN DISPOSABLE) ×2 IMPLANT
GOWN STRL REUS W/ TWL XL LVL3 (GOWN DISPOSABLE) ×1 IMPLANT
GOWN STRL REUS W/TWL LRG LVL3 (GOWN DISPOSABLE) ×4
GOWN STRL REUS W/TWL XL LVL3 (GOWN DISPOSABLE) ×2
KIT BASIN OR (CUSTOM PROCEDURE TRAY) ×3 IMPLANT
KIT ROOM TURNOVER OR (KITS) ×3 IMPLANT
MANIFOLD NEPTUNE II (INSTRUMENTS) ×3 IMPLANT
NEEDLE 22X1 1/2 (OR ONLY) (NEEDLE) IMPLANT
NS IRRIG 1000ML POUR BTL (IV SOLUTION) ×3 IMPLANT
PACK ORTHO EXTREMITY (CUSTOM PROCEDURE TRAY) ×3 IMPLANT
PAD ABD 8X10 STRL (GAUZE/BANDAGES/DRESSINGS) ×3 IMPLANT
PAD ARMBOARD 7.5X6 YLW CONV (MISCELLANEOUS) ×6 IMPLANT
PADDING CAST COTTON 6X4 STRL (CAST SUPPLIES) IMPLANT
SCRUB POVIDONE IODINE 4 OZ (MISCELLANEOUS) ×3 IMPLANT
SPONGE LAP 18X18 X RAY DECT (DISPOSABLE) ×3 IMPLANT
STAPLER VISISTAT 35W (STAPLE) ×3 IMPLANT
STOCKINETTE IMPERVIOUS LG (DRAPES) IMPLANT
STRIP CLOSURE SKIN 1/2X4 (GAUZE/BANDAGES/DRESSINGS) IMPLANT
SUCTION FRAZIER HANDLE 10FR (MISCELLANEOUS)
SUCTION TUBE FRAZIER 10FR DISP (MISCELLANEOUS) IMPLANT
SUT ETHILON 3 0 PS 1 (SUTURE) ×6 IMPLANT
TOWEL OR 17X24 6PK STRL BLUE (TOWEL DISPOSABLE) ×3 IMPLANT
TOWEL OR 17X26 10 PK STRL BLUE (TOWEL DISPOSABLE) ×3 IMPLANT
TUBE CONNECTING 12'X1/4 (SUCTIONS) ×1
TUBE CONNECTING 12X1/4 (SUCTIONS) ×2 IMPLANT
UNDERPAD 30X30 (UNDERPADS AND DIAPERS) ×3 IMPLANT
WATER STERILE IRR 1000ML POUR (IV SOLUTION) IMPLANT
YANKAUER SUCT BULB TIP NO VENT (SUCTIONS) ×3 IMPLANT

## 2017-08-14 NOTE — Transfer of Care (Signed)
Immediate Anesthesia Transfer of Care Note  Patient: Peter Morrow  Procedure(s) Performed: HARDWARE REMOVAL OF LEFT TIBIA BIOMET VERSANAIL AND OIC SCREW (Left Leg Upper)  Patient Location: PACU  Anesthesia Type:General  Level of Consciousness: awake, oriented and patient cooperative  Airway & Oxygen Therapy: Patient Spontanous Breathing and Patient connected to nasal cannula oxygen  Post-op Assessment: Report given to RN and Post -op Vital signs reviewed and stable  Post vital signs: Reviewed  Last Vitals:  Vitals:   08/14/17 0622 08/14/17 0623  BP:  (!) 141/98  Pulse: 85   Resp: 20   Temp: 37.2 C   SpO2: 97%     Last Pain:  Vitals:   08/14/17 0622  TempSrc: Oral      Patients Stated Pain Goal: 2 (08/14/17 0731)  Complications: No apparent anesthesia complications

## 2017-08-14 NOTE — Anesthesia Postprocedure Evaluation (Signed)
Anesthesia Post Note  Patient: Ebony HailMichael Salaam  Procedure(s) Performed: HARDWARE REMOVAL OF LEFT TIBIA BIOMET VERSANAIL AND OIC SCREW (Left Leg Upper)     Patient location during evaluation: PACU Anesthesia Type: General Level of consciousness: awake and alert Pain management: pain level controlled Vital Signs Assessment: post-procedure vital signs reviewed and stable Respiratory status: spontaneous breathing, nonlabored ventilation, respiratory function stable and patient connected to nasal cannula oxygen Cardiovascular status: blood pressure returned to baseline and stable Postop Assessment: no apparent nausea or vomiting Anesthetic complications: no    Last Vitals:  Vitals:   08/14/17 1053 08/14/17 1100  BP: (!) 131/93 131/88  Pulse: 66 73  Resp: 18 18  Temp:    SpO2: 97% 99%    Last Pain:  Vitals:   08/14/17 1053  TempSrc:   PainSc: 4                  Trevor IhaStephen A Houser

## 2017-08-14 NOTE — OR Nursing (Signed)
Explanted hardware sent with patient to PACU.

## 2017-08-14 NOTE — Anesthesia Procedure Notes (Signed)
Procedure Name: Intubation Date/Time: 08/14/2017 8:10 AM Performed by: Lovie Cholock, Willer Osorno K, CRNA Pre-anesthesia Checklist: Patient identified, Emergency Drugs available, Suction available and Patient being monitored Patient Re-evaluated:Patient Re-evaluated prior to induction Oxygen Delivery Method: Circle System Utilized Preoxygenation: Pre-oxygenation with 100% oxygen Induction Type: IV induction Ventilation: Mask ventilation without difficulty Laryngoscope Size: Miller and 3 Grade View: Grade I Tube type: Oral Tube size: 7.5 mm Number of attempts: 1 Airway Equipment and Method: Stylet and Oral airway Placement Confirmation: ETT inserted through vocal cords under direct vision,  positive ETCO2 and breath sounds checked- equal and bilateral Secured at: 22 cm Tube secured with: Tape Dental Injury: Teeth and Oropharynx as per pre-operative assessment

## 2017-08-14 NOTE — Brief Op Note (Signed)
08/14/2017  10:13 AM  PATIENT:  Peter Morrow  37 y.o. male  PRE-OPERATIVE DIAGNOSIS:   1. symptomatic hardware left si joint 2. symptomatic hardware left tibia  POST-OPERATIVE DIAGNOSIS:   1. symptomatic hardware left si joint 2. symptomatic hardware left tibia  PROCEDURE:  Procedure(s): 1. HARDWARE REMOVAL OF LEFT TIBIA BIOMET VERSANAIL 2. HARDWARE REMOVAL OF LEFT SACROILIAC SCREW  SURGEON:  Surgeon(s) and Role:    * Myrene GalasHandy, Kase, MD - Primary  PHYSICIAN ASSISTANT: KEITH PAUL PA-C  ANESTHESIA:   general  EBL:  50 mL   BLOOD ADMINISTERED:none  DRAINS: none   LOCAL MEDICATIONS USED:  NONE  SPECIMEN:  No Specimen  DISPOSITION OF SPECIMEN:  N/A  COUNTS:  YES  TOURNIQUET:  * No tourniquets in log *  DICTATION: .Other Dictation: Dictation Number [id number]  PLAN OF CARE: Discharge to home after PACU  PATIENT DISPOSITION:  PACU - hemodynamically stable.   Delay start of Pharmacological VTE agent (>24hrs) due to surgical blood loss or risk of bleeding: no

## 2017-08-14 NOTE — Discharge Instructions (Signed)
[facility] Discharge Instructions   General Discharge Instructions  WEIGHT BEARING STATUS: Weightbearing as tolerated Left Leg  RANGE OF MOTION/ACTIVITY: unrestricted range of motion L knee and ankle   Wound Care: daily wound care as needed starting on 08/16/2017. See below   Discharge Wound Care Instructions  Do NOT apply any ointments, solutions or lotions to pin sites or surgical wounds.  These prevent needed drainage and even though solutions like hydrogen peroxide kill bacteria, they also damage cells lining the pin sites that help fight infection.  Applying lotions or ointments can keep the wounds moist and can cause them to breakdown and open up as well. This can increase the risk for infection. When in doubt call the office.  Surgical incisions should be dressed daily.  If any drainage is noted, use one layer of adaptic, then gauze, Kerlix, and an ace wrap.  Once the incision is completely dry and without drainage, it may be left open to air out.  Showering may begin 36-48 hours later.  Cleaning gently with soap and water.  Traumatic wounds should be dressed daily as well.    One layer of adaptic, gauze, Kerlix, then ace wrap.  The adaptic can be discontinued once the draining has ceased    If you have a wet to dry dressing: wet the gauze with saline the squeeze as much saline out so the gauze is moist (not soaking wet), place moistened gauze over wound, then place a dry gauze over the moist one, followed by Kerlix wrap, then ace wrap.  Diet: as you were eating previously.  Can use over the counter stool softeners and bowel preparations, such as Miralax, to help with bowel movements.  Narcotics can be constipating.  Be sure to drink plenty of fluids  PAIN MEDICATION USE AND EXPECTATIONS  You have likely been given narcotic medications to help control your pain.  After a traumatic event that results in an fracture (broken bone) with or without surgery, it is ok to  use narcotic pain medications to help control one's pain.  We understand that everyone responds to pain differently and each individual patient will be evaluated on a regular basis for the continued need for narcotic medications. Ideally, narcotic medication use should last no more than 6-8 weeks (coinciding with fracture healing).   As a patient it is your responsibility as well to monitor narcotic medication use and report the amount and frequency you use these medications when you come to your office visit.   We would also advise that if you are using narcotic medications, you should take a dose prior to therapy to maximize you participation.  IF YOU ARE ON NARCOTIC MEDICATIONS IT IS NOT PERMISSIBLE TO OPERATE A MOTOR VEHICLE (MOTORCYCLE/CAR/TRUCK/MOPED) OR HEAVY MACHINERY DO NOT MIX NARCOTICS WITH OTHER CNS (CENTRAL NERVOUS SYSTEM) DEPRESSANTS SUCH AS ALCOHOL   STOP SMOKING OR USING NICOTINE PRODUCTS!!!!  As discussed nicotine severely impairs your body's ability to heal surgical and traumatic wounds but also impairs bone healing.  Wounds and bone heal by forming microscopic blood vessels (angiogenesis) and nicotine is a vasoconstrictor (essentially, shrinks blood vessels).  Therefore, if vasoconstriction occurs to these microscopic blood vessels they essentially disappear and are unable to deliver necessary nutrients to the healing tissue.  This is one modifiable factor that you can do to dramatically increase your chances of healing your injury.    (This means no smoking, no nicotine gum, patches, etc)  DO NOT USE NONSTEROIDAL ANTI-INFLAMMATORY DRUGS (NSAID'S)  Using products such as Advil (ibuprofen), Aleve (naproxen), Motrin (ibuprofen) for additional pain control during fracture healing can delay and/or prevent the healing response.  If you would like to take over the counter (OTC) medication, Tylenol (acetaminophen) is ok.  However, some narcotic medications that are given for pain control  contain acetaminophen as well. Therefore, you should not exceed more than 4000 mg of tylenol in a day if you do not have liver disease.  Also note that there are may OTC medicines, such as cold medicines and allergy medicines that my contain tylenol as well.  If you have any questions about medications and/or interactions please ask your doctor/PA or your pharmacist.      ICE AND ELEVATE INJURED/OPERATIVE EXTREMITY  Using ice and elevating the injured extremity above your heart can help with swelling and pain control.  Icing in a pulsatile fashion, such as 20 minutes on and 20 minutes off, can be followed.    Do not place ice directly on skin. Make sure there is a barrier between to skin and the ice pack.    Using frozen items such as frozen peas works well as the conform nicely to the are that needs to be iced.  USE AN ACE WRAP OR TED HOSE FOR SWELLING CONTROL  In addition to icing and elevation, Ace wraps or TED hose are used to help limit and resolve swelling.  It is recommended to use Ace wraps or TED hose until you are informed to stop.    When using Ace Wraps start the wrapping distally (farthest away from the body) and wrap proximally (closer to the body)   Example: If you had surgery on your leg or thing and you do not have a splint on, start the ace wrap at the toes and work your way up to the thigh        If you had surgery on your upper extremity and do not have a splint on, start the ace wrap at your fingers and work your way up to the upper arm  IF YOU ARE IN A SPLINT OR CAST DO NOT REMOVE IT FOR ANY REASON   If your splint gets wet for any reason please contact the office immediately. You may shower in your splint or cast as long as you keep it dry.  This can be done by wrapping in a cast cover or garbage back (or similar)  Do Not stick any thing down your splint or cast such as pencils, money, or hangers to try and scratch yourself with.  If you feel itchy take benadryl as prescribed  on the bottle for itching  IF YOU ARE IN A CAM BOOT (BLACK BOOT)  You may remove boot periodically. Perform daily dressing changes as noted below.  Wash the liner of the boot regularly and wear a sock when wearing the boot. It is recommended that you sleep in the boot until told otherwise  CALL THE OFFICE WITH ANY QUESTIONS OR CONCERNS: [phone]

## 2017-08-15 ENCOUNTER — Encounter (HOSPITAL_COMMUNITY): Payer: Self-pay | Admitting: Orthopedic Surgery

## 2017-08-21 NOTE — Op Note (Signed)
NAME:  Peter Morrow, Peter Morrow NO.:  [id number]  MEDICAL RECORD NO.:  [id number]  LOCATION:                                 FACILITY:  PHYSICIAN:  Saharsh Sterling. Carola Frost, M.D. DATE OF BIRTH:  08-19-1980  DATE OF PROCEDURE:  08/14/2017 DATE OF DISCHARGE:  08/14/2017                              OPERATIVE REPORT   PREOPERATIVE DIAGNOSES: 1. Symptomatic hardware, left sacroiliac joint. 2. Symptomatic hardware, left tibia.  POSTOPERATIVE DIAGNOSES: 1. Symptomatic hardware, left sacroiliac joint. 2. Symptomatic hardware, left tibia.  PROCEDURES: 1. Removal of deep implant of left tibia, Biomet VersaNail. 2. Removal of left SI screw.  SURGEON:  Ajahni Nay. Carola Frost, MD.  ASSISTANT:  Montez Morita, PA-C.  ANESTHESIA:  General.  COMPLICATIONS:  None.  ESTIMATED BLOOD LOSS:  50 mL.  DRAINS:  None.  LOCAL MEDICATIONS:  None.  DISPOSITION:  To PACU.  CONDITION:  Stable.  BRIEF SUMMARY OF INDICATION FOR PROCEDURE:  Peter Morrow is a very pleasant 37 year old male who had a tree fall on him, sustaining a pelvic ring disruption and tibia fracture.  The patient went on to unite and now presents for elective removal of the hardware, just continued to have tenderness about the implants that did not resolve with conservative measures and observation.  I did discuss with him the risks and benefits including the possibility of failure to alleviate symptoms, need for further surgery, DVT, PE, heart attack, stroke, anesthetic complications, infection, and others and strongly wished to proceed.  BRIEF SUMMARY OF PROCEDURE:  The patient was taken to the operating room where general anesthesia was induced.  He did receive preoperative antibiotics.  His pelvis and left lower extremity were prepped and draped in usual sterile fashion.  Time-out was held.  C-arm was brought in to identify the correct starting point.  The old incision was remade about the pelvis.  The pin for the  cannulated screw was then advanced down to the head of the screw and with the assistance of x-ray, carefully advanced into the center of the screw.  I then removed the screw without complication.  I did not attempt to retrieve the washer as more harm than benefit would be expected from disruption of the tissues to remove it.  The wound was irrigated thoroughly and closed in standard fashion with 3-0 nylon.  Attention was then turned distally.  Here, I remade the old incisions for the locking bolts with through each screw without difficulty and then remade the incision that had been used for insertion about the anterior aspect of the knee.  A medial parapatellar retinacular incision was made.  The curette initially advanced into the center of the nail and then the extraction bolt inserted, engaged while placing a curette within the locking bolt site distally in order to prevent rotation and after it was fully engaged, the nail was extracted without difficulty, being careful to avoid injury to the bones and soft tissues during traction.  The wound was irrigated thoroughly, closed in standard layered fashion with 0 Vicryl for the retinaculum, 2-0 Vicryl and 2-0 nylon for the skin.  Sterile gently compressive dressing was applied and then Ace wrap from foot to thigh.  The  patient was awakened from anesthesia and transported to PACU in stable condition.  Montez MoritaKeith Paul, PA- C, did assist me throughout with the implant removal and was helpful for stabilizing the knee, preventing rotation during engagement of the extraction bolt.  PROGNOSIS:  Peter Morrow will be weightbearing as tolerated.  He may shower in 2 days.  We will plan to see him back for removal of his sutures in 10.  We would not anticipate the need for formal DVT prophylaxis.     Doralee AlbinoMichael H. Carola FrostHandy, M.D.     MHH/MEDQ  D:  08/20/2017  T:  08/21/2017  Job:  [id number]

## 2018-11-11 IMAGING — CR DG TIBIA/FIBULA PORT 2V*L*
4 series · 4 of 4 positions shown · non-contrast
Comparison: C-arm fluoroscopic intraoperative views of the tibia
and fibula, preoperative radiographs of the left tibia and fibula
from earlier on the same day.

CLINICAL DATA: Postop tibial fixation with intramedullary rod.

EXAM:
PORTABLE LEFT TIBIA AND FIBULA - 2 VIEW

[AP (1 of 2)]
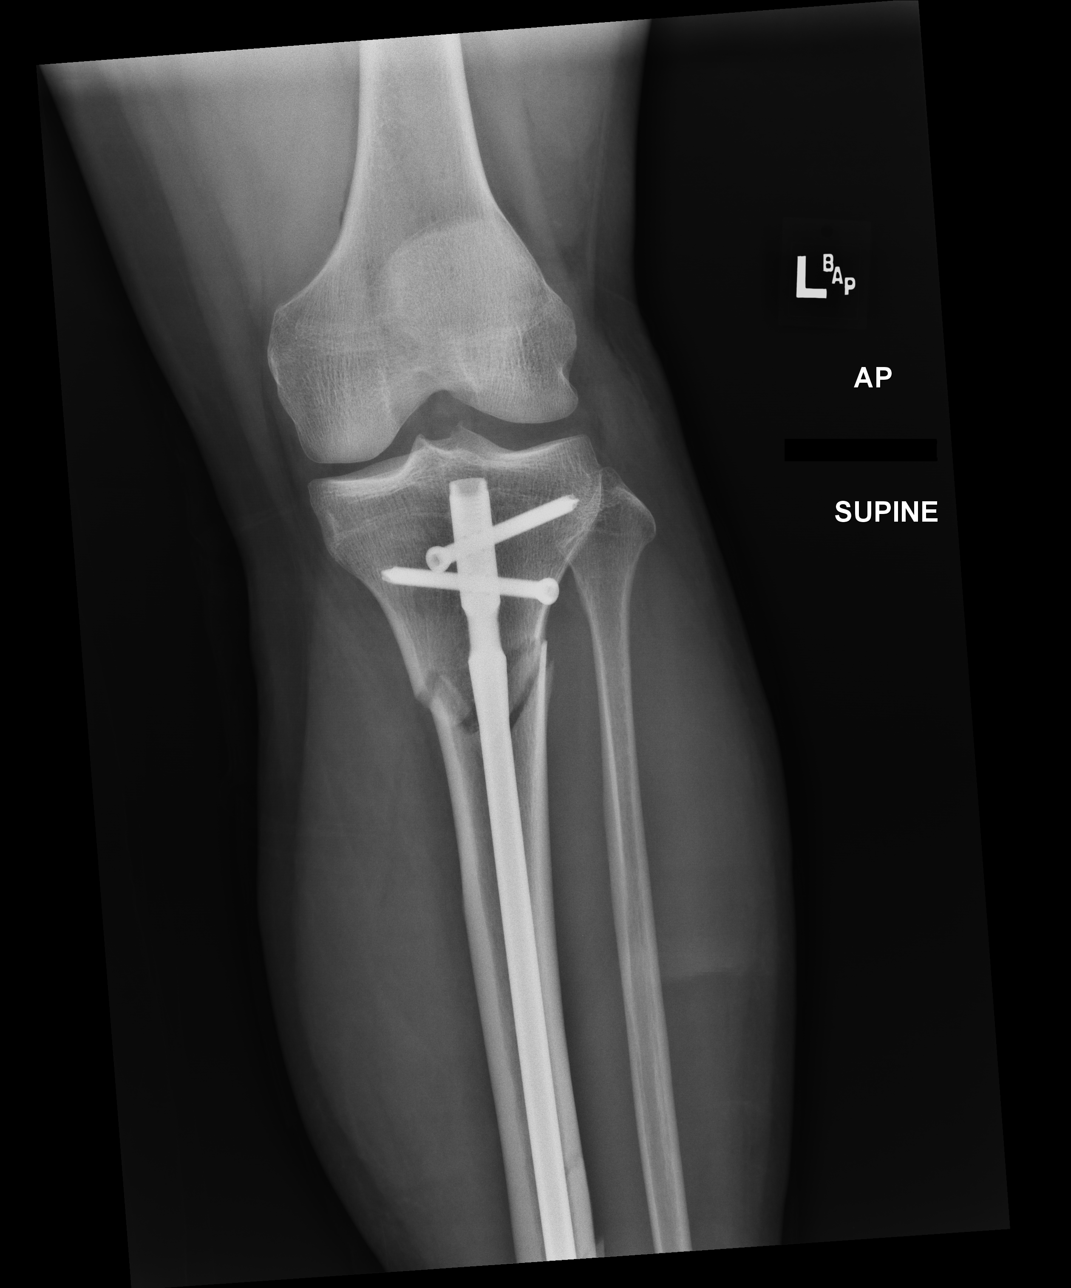

[AP (2 of 2)]
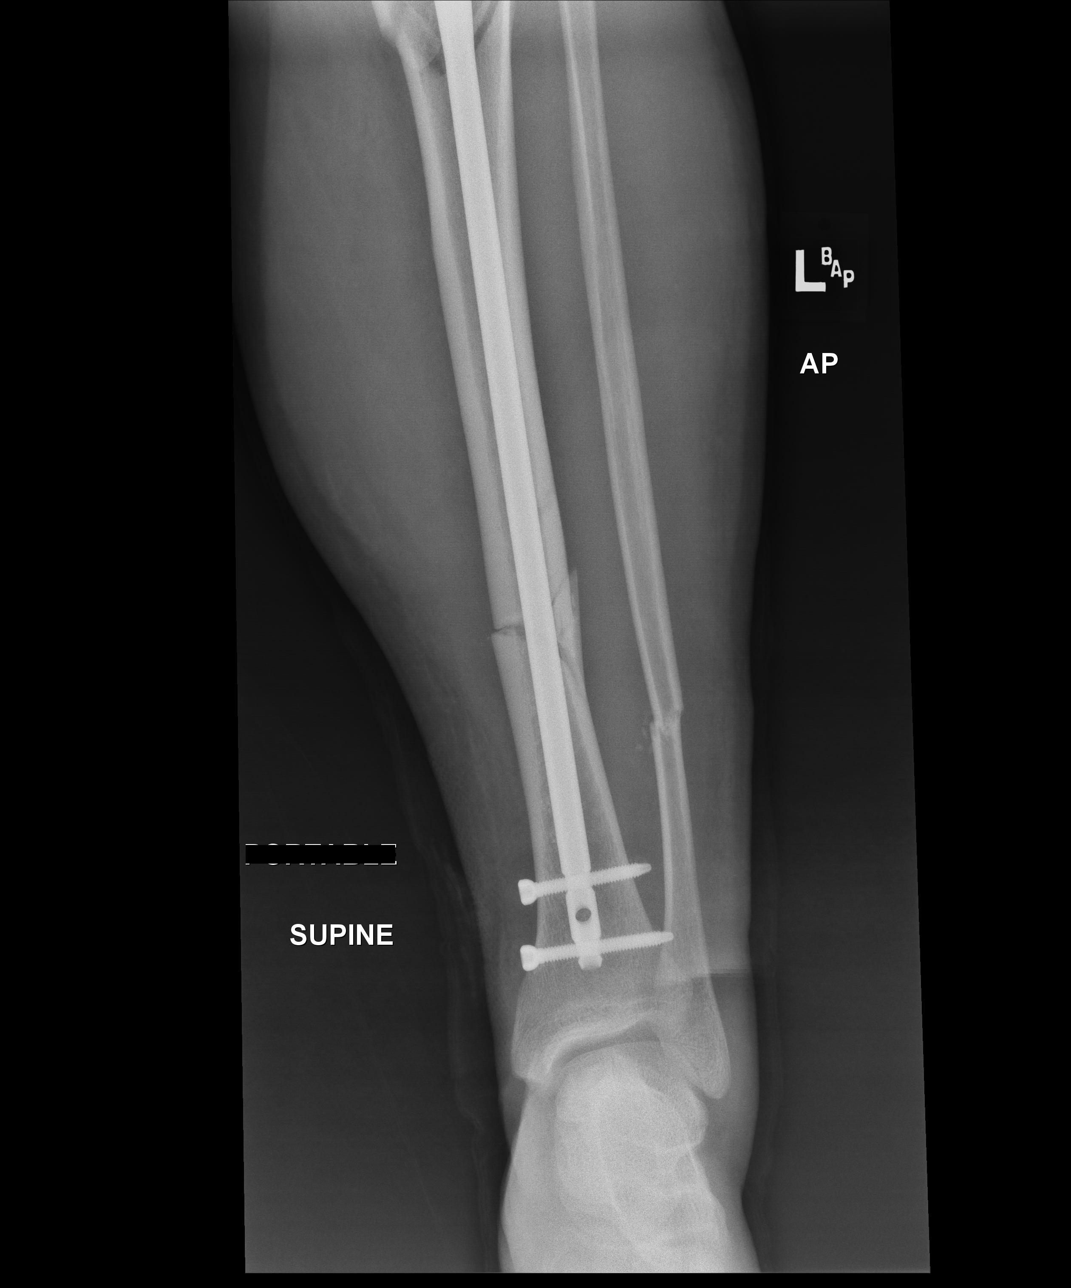

[lateral (1 of 2)]
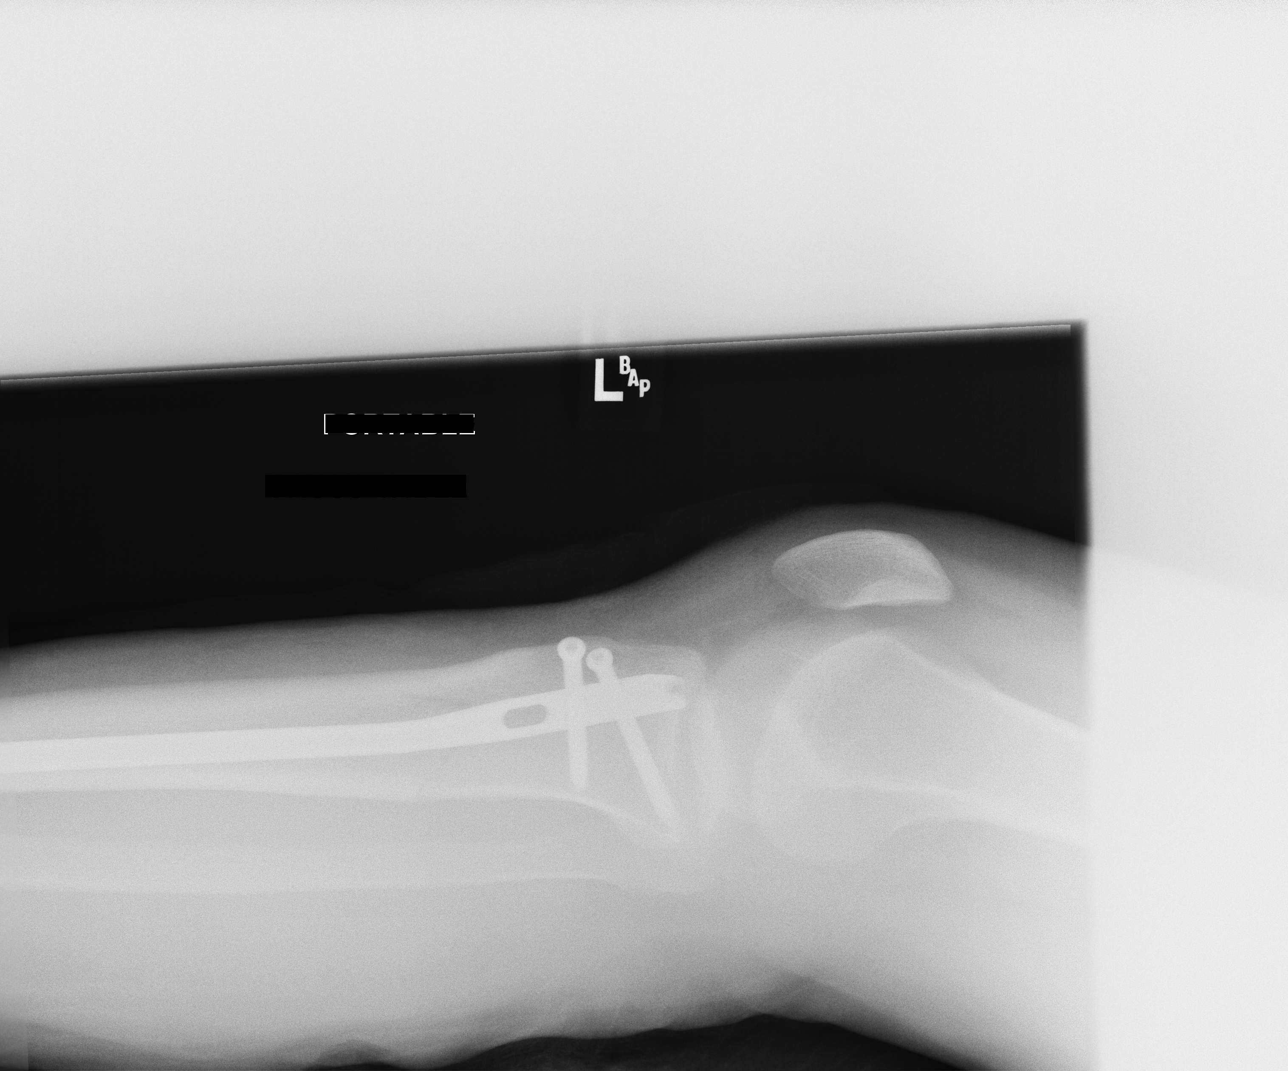

[lateral (2 of 2)]
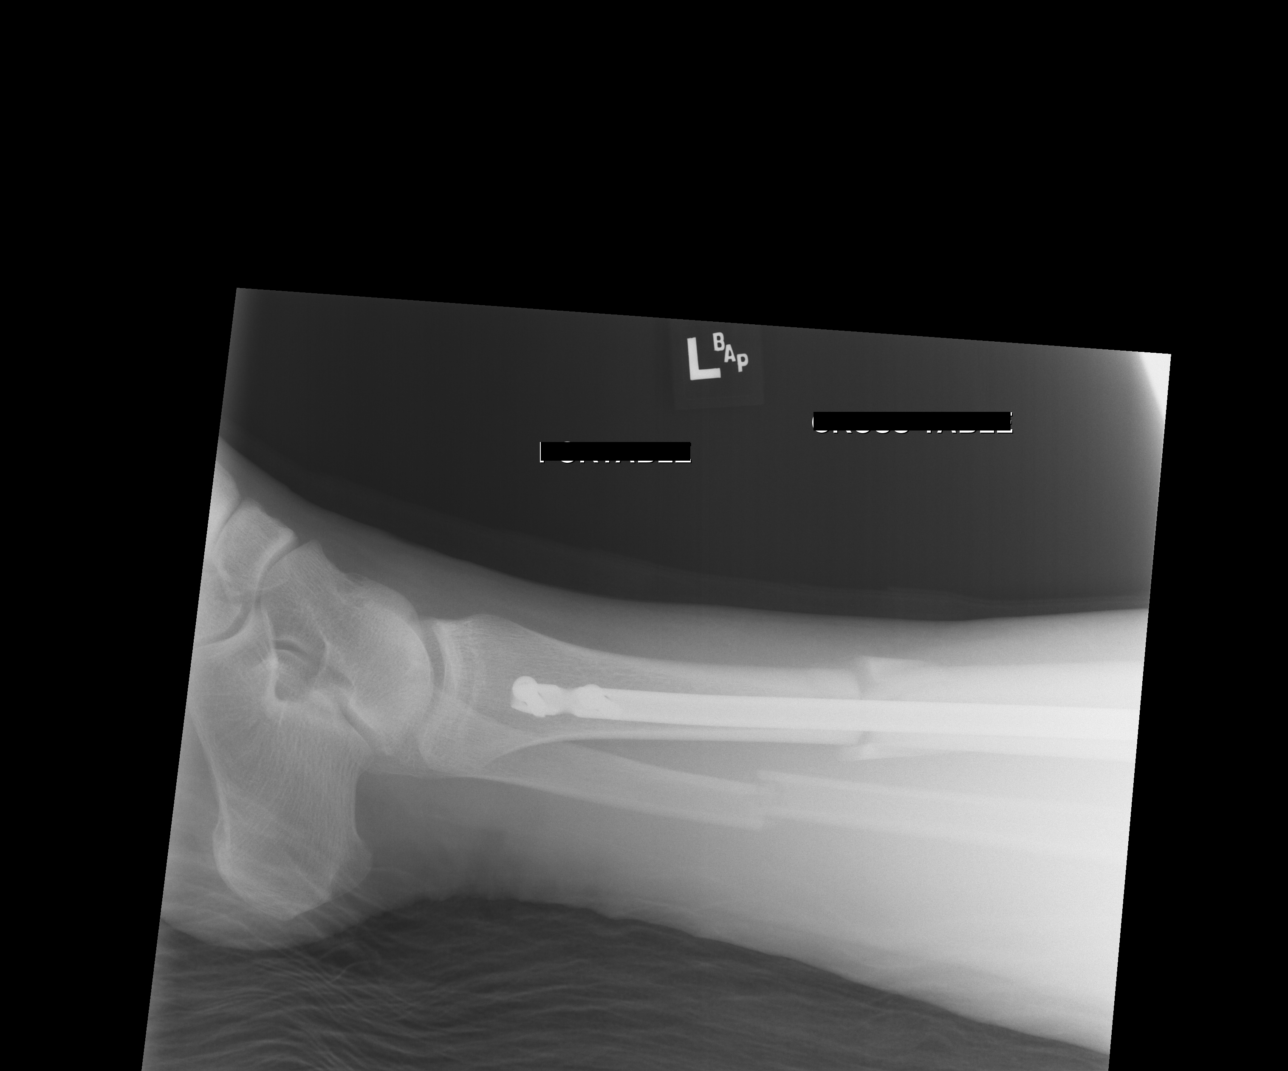

[4 of 4 positions shown; findings below may reference images not displayed]

FINDINGS: Intramedullary rod fixation across proximal and distal tibial shaft
fractures are noted with improved alignment. Of the distal fracture
of the tibia is at the junction of the middle and distal third with
comminution. No significant displacement is identified. The proximal
tibial shaft fracture is just below the tibial tuberosity. A
fracture at the junction of the middle and distal third of the
fibula is seen with slight posterior and medial displacement of the
distal fracture fragment on current study.
IMPRESSION: Intramedullary rod fixation across proximal and distal tibial
fractures as above described with improved alignment since the
preoperative exam. Fracture of the junction of the middle and distal
third of the fibula with slight medial and dorsal displacement of
the distal fracture fragment.

## 2018-11-11 IMAGING — CT CT ABD-PELV W/ CM
2 of 6 series · 15 of 46 positions shown, 17 images · IV contrast (Omni 300)
Comparison: Plain radiography same day

CLINICAL DATA: Struck by a falling tree. Left-sided hip and pelvic
pain.

EXAM:
CT ABDOMEN AND PELVIS WITH CONTRAST
TECHNIQUE: Multidetector CT imaging of the abdomen and pelvis was performed
using the standard protocol following bolus administration of
intravenous contrast.
CONTRAST:  100 cc [id number]

[Series 5: a/p w/ cor · coronal · 0.90mm/px · 3 of 152 slices shown]
[im 51/152  soft-tissue]
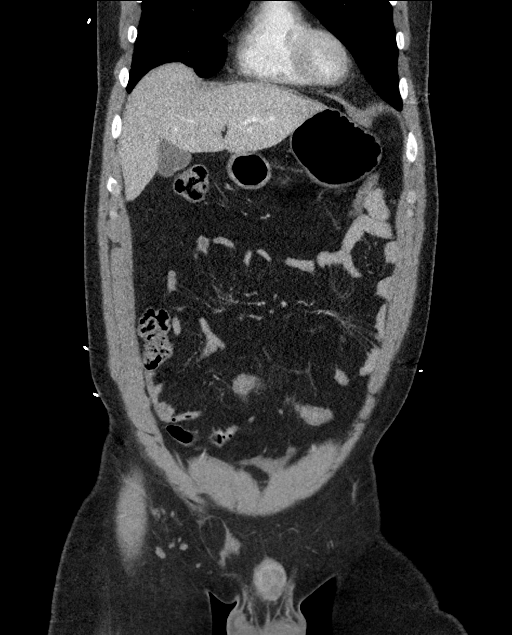
[im 68/152  soft-tissue]
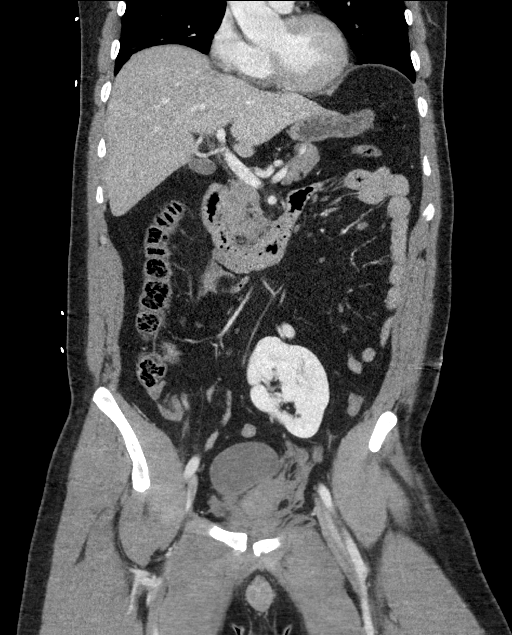
[im 84/152  soft-tissue]
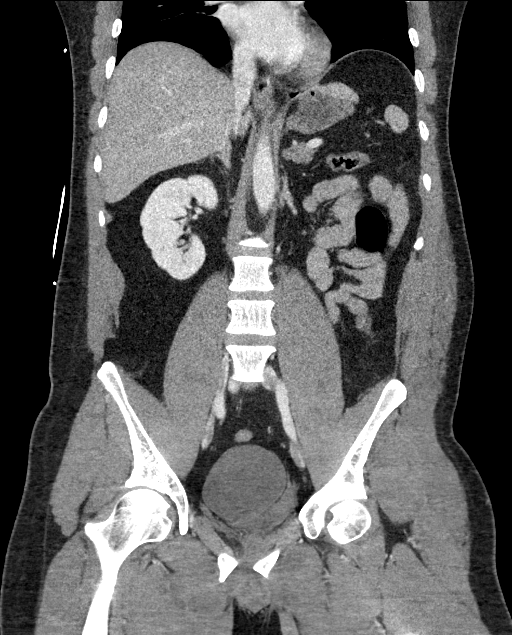

[Series 9: axial 2x2 · axial · 0.98mm/px · z∈[+1002,+1486]mm · 12 of 1144 slices shown, 14 images]
[im 88/1144  soft-tissue]
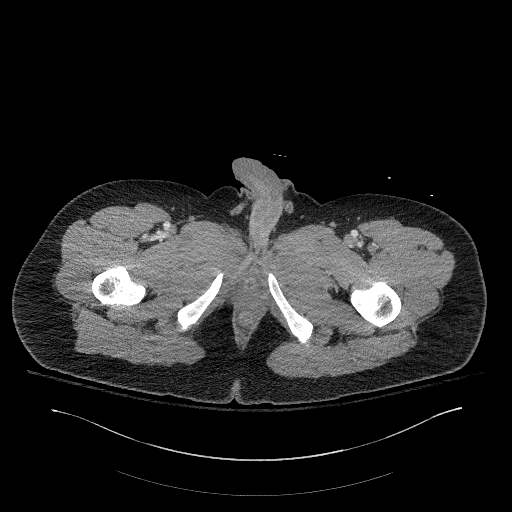
[im 88/1144  bone]
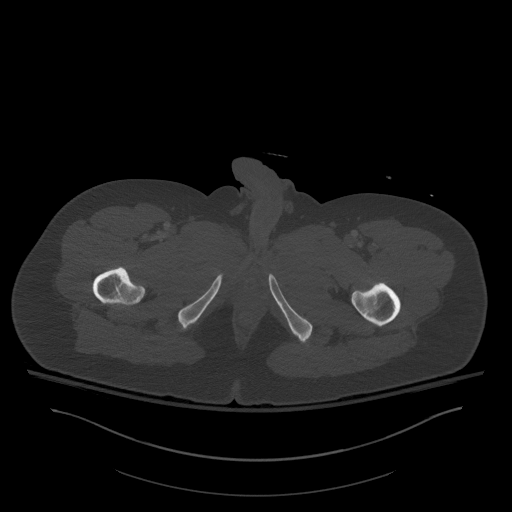
[im 176/1144  soft-tissue]
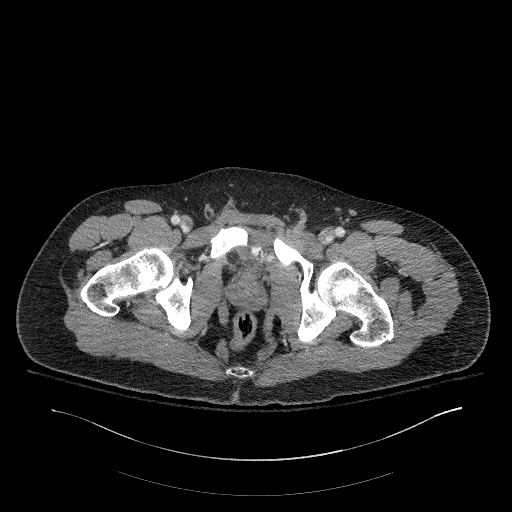
[im 264/1144  soft-tissue]
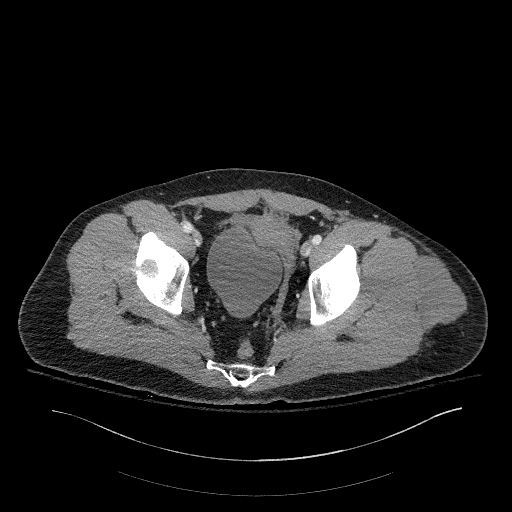
[im 352/1144  soft-tissue]
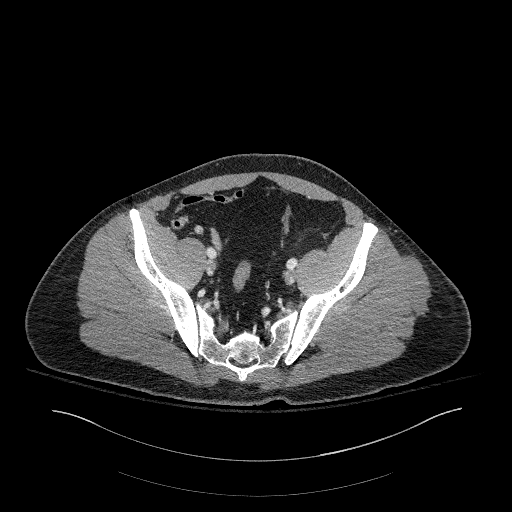
[im 440/1144  soft-tissue]
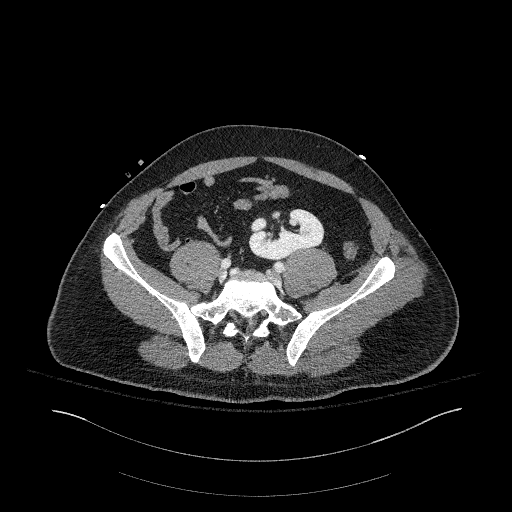
[im 528/1144  soft-tissue]
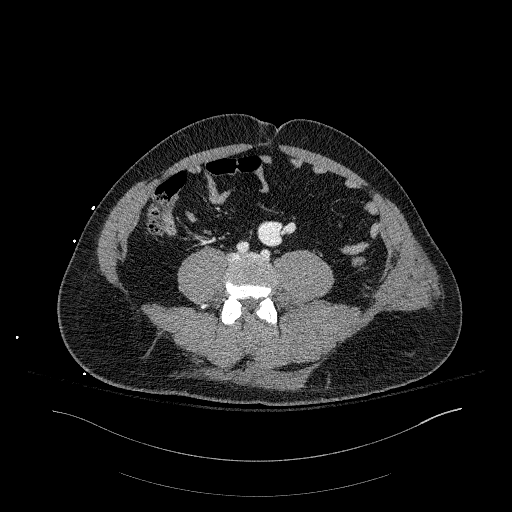
[im 616/1144  soft-tissue]
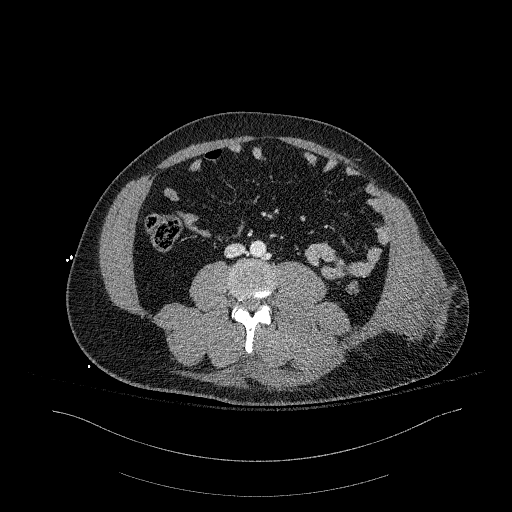
[im 704/1144  soft-tissue]
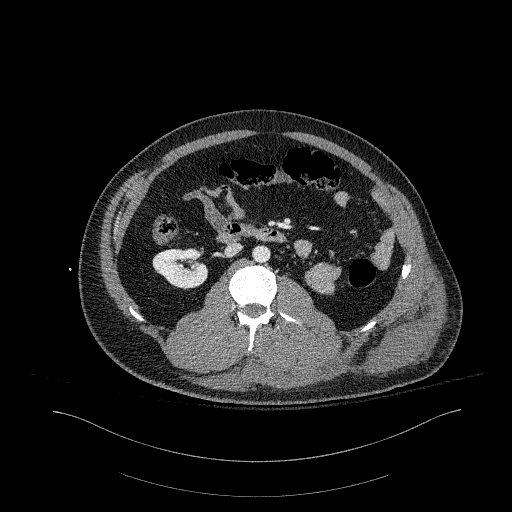
[im 792/1144  soft-tissue]
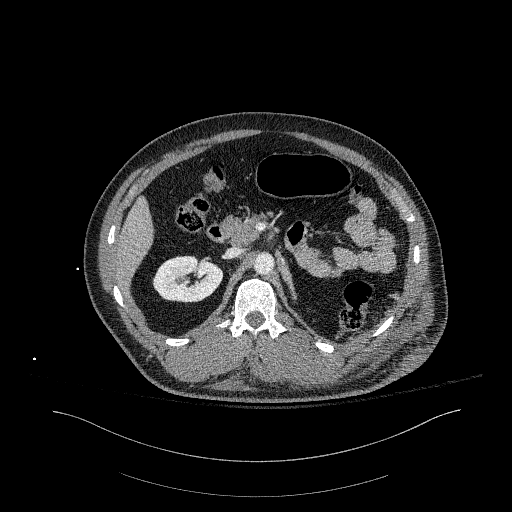
[im 792/1144  bone]
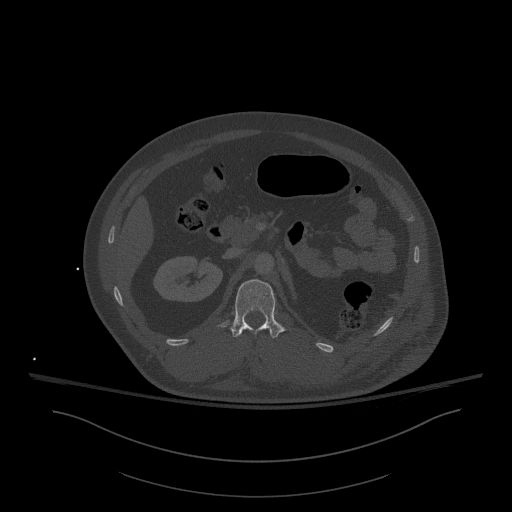
[im 880/1144  soft-tissue]
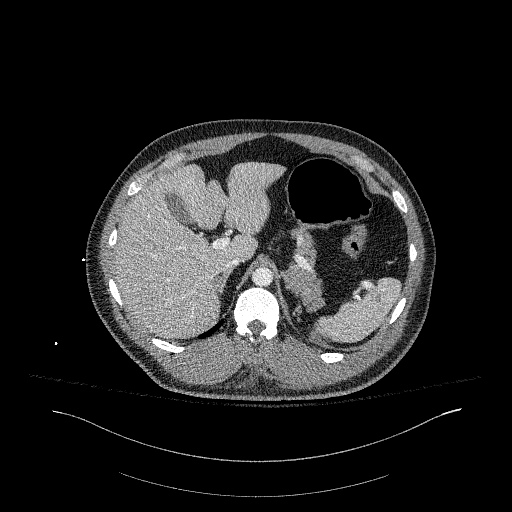
[im 968/1144  soft-tissue]
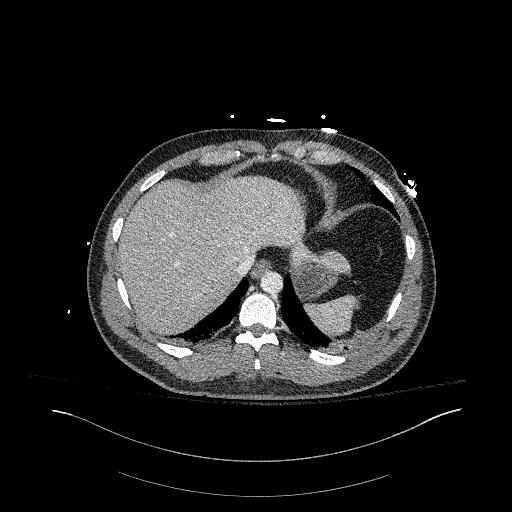
[im 1056/1144  soft-tissue]
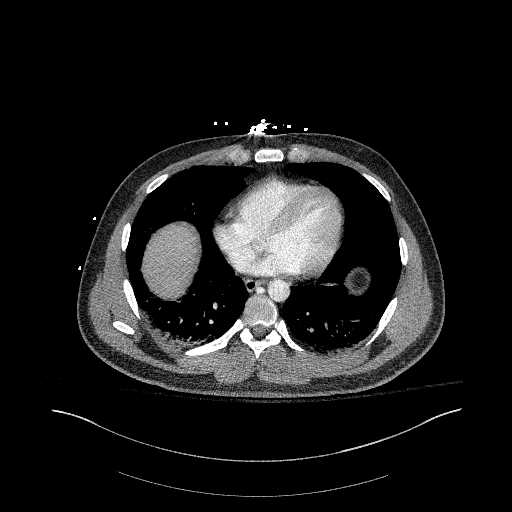

[15 of 46 positions shown; findings below may reference images not displayed]

FINDINGS: Lower chest: Mild atelectasis at both dependent lung bases. No
pneumothorax or hemothorax. No pericardial fluid.

Hepatobiliary: Liver parenchyma is normal. No calcified gallstones.
No ductal dilatation.

Pancreas: Normal

Spleen: Normal

Adrenals/Urinary Tract: Adrenal glands are normal. Right kidney is
normal. Left kidney has a congenitally low position at the pelvic g.
No sign of renal injury. Bladder appears intact. Pelvic hematoma
external to the bladder. See below.

Stomach/Bowel: Normal

Vascular/Lymphatic: Aorta and IVC are normal. No retroperitoneal
mass or lymphadenopathy. See below for traumatic findings in the
pelvis.

Reproductive: Normal

Other: No free intraperitoneal fluid or air.

Musculoskeletal: Minimal displaced fractures of the left second,
third, fourth and fifth transverse processes. Nondisplaced right
sacral fracture. Even possible this could be old. Favor at least
some acute component. Mild diastases of the left sacroiliac joint.
Mild diet stasis at the pubic symphysis. Adjacent active
extravasation is evident. Extraperitoneal hematoma in the anterior
and left pelvis with some mass effect upon the bladder. Some
bleeding into the paraspinous musculature relative to the transverse
process fractures. Left lower abdominal musculature hematoma and
subcutaneous fat hematoma and hematoma within the soft tissues
lateral to the left hip.
IMPRESSION: Lumbar spine left transverse process fractures 2 through 5. Some
associated muscular hemorrhage.

Mild diastases of the left sacroiliac joint and symphysis pubis.
Active arterial extravasation adjacent to the symphysis pubis with
extraperitoneal hemorrhage with mass-effect upon the anterior
bladder, extending along the left pelvic sidewall.

Abnormal appearance of the right sacrum. I question whether this is
an old sacral fracture. If there is not a clear history of old
pelvic injury, this must be presumed represent an acute right sacral
fracture.

Soft tissue hemorrhage affecting the left-sided abdominal wall
musculature in superficial fat and the superficial fat lateral to
the left hip.

Left pelvic kidney incidentally noted.

Critical Value/emergent results were called by telephone at the time
of interpretation on 09/17/2016 at [DATE] to Dr. YENIS MARADIAGA , who
verbally acknowledged these results.

## 2018-11-11 IMAGING — CR DG PORTABLE PELVIS
1 series · 1 of 1 positions shown · non-contrast
Comparison: None.

CLINICAL DATA: Struck by a falling tree. Left-sided pelvic and hip
pain.

EXAM:
PORTABLE PELVIS 1-2 VIEWS

[AP]
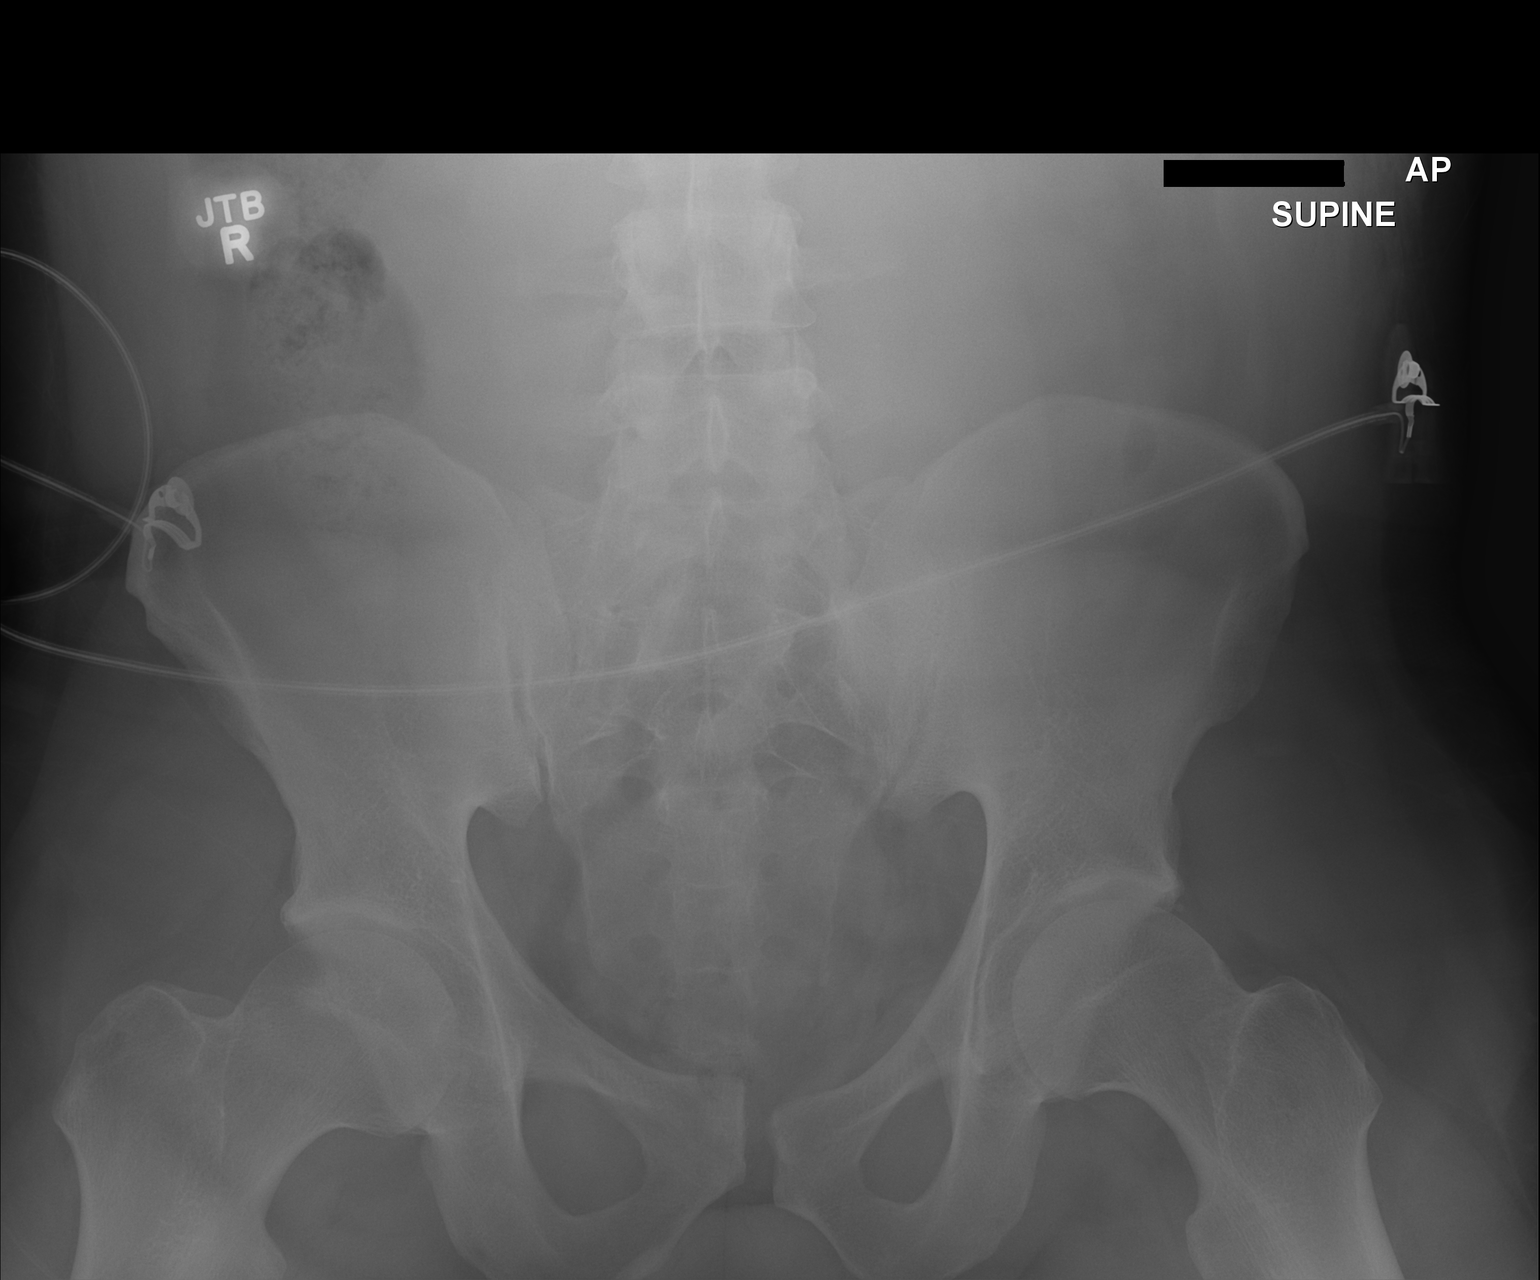

[1 of 1 positions shown; findings below may reference images not displayed]

FINDINGS: Offset of the symphysis pubis with the left side being lower than
the right. No clear sacral fracture visible on this one view.
Question struck disruption on the right. CT scan suggested.
IMPRESSION: Offset at the symphysis pubis indicating pelvic ring disruption.
Question sacral strut disruption on the right. CT recommended.

## 2022-04-16 ENCOUNTER — Ambulatory Visit
Admission: RE | Admit: 2022-04-16 | Discharge: 2022-04-16 | Disposition: A | Discharge status: Home / Self Care | Payer: BC Managed Care – PPO | Source: Ambulatory Visit

## 2022-04-16 ENCOUNTER — Ambulatory Visit (INDEPENDENT_AMBULATORY_CARE_PROVIDER_SITE_OTHER): Payer: BC Managed Care – PPO

## 2022-04-16 VITALS — BP 151/115 | HR 112 | Temp 98.7°F | Resp 16

## 2022-04-16 DIAGNOSIS — R030 Elevated blood-pressure reading, without diagnosis of hypertension: Secondary | ICD-10-CM | POA: Diagnosis not present

## 2022-04-16 DIAGNOSIS — J209 Acute bronchitis, unspecified: Secondary | ICD-10-CM

## 2022-04-16 DIAGNOSIS — R0602 Shortness of breath: Secondary | ICD-10-CM | POA: Diagnosis not present

## 2022-04-16 MED ORDER — PROMETHAZINE-DM 6.25-15 MG/5ML PO SYRP: 2.5000 mL | ORAL_SOLUTION | Freq: Three times a day (TID) | ORAL | 0 refills | Status: AC | PRN

## 2022-04-16 MED ORDER — ALBUTEROL SULFATE HFA 108 (90 BASE) MCG/ACT IN AERS: 1.0000 | INHALATION_SPRAY | Freq: Four times a day (QID) | RESPIRATORY_TRACT | 0 refills | Status: AC | PRN

## 2022-04-16 MED ORDER — PREDNISONE 50 MG PO TABS: 50.0000 mg | ORAL_TABLET | Freq: Every day | ORAL | 0 refills | Status: AC

## 2022-04-16 NOTE — ED Triage Notes (Signed)
Pt c/o cough for 3wks. 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 Pt denies taking any meds.

## 2022-04-16 NOTE — ED Provider Notes (Signed)
[facility]  Note:  This document was prepared using Conservation officer, historic buildings and may include unintentional dictation errors.  MRN: [id number] DOB: 05-02-1981  Subjective:   Peter Morrow is a 41 y.o. male presenting for 3-week history of persistent intermittent coughing, shortness of breath and feeling easily winded.  Patient has backed off of his physical activities for the same reason.  Has not been taking any medications for his symptoms.  Has a known history of whitecoat syndrome.  No history of hypertension.  Patient regularly checks his vital signs prior to coming into the clinic as he consistently has dramatic increases when he is in clinic.  Blood pressure was 118/79 at home with a pulse of 96 bpm.  No history of respiratory disorders.  No history of pulmonary embolism.  Patient is not a smoker.  No drug use.  No vaping.  No current facility-administered medications for this encounter.  Current Outpatient Medications:    TRINTELLIX 20 MG TABS tablet, Take 20 mg by mouth daily., Disp: , Rfl:    No Known Allergies  Past Medical History:  Diagnosis Date   Anxiety disorder    with history of panic attacks   Depression    Displaced segmental fracture of shaft of left tibia, initial encounter for closed fracture 09/19/2016   Distal radius fracture--right 2017     Past Surgical History:  Procedure Laterality Date   HARDWARE REMOVAL Left 08/14/2017   Procedure: HARDWARE REMOVAL OF LEFT TIBIA BIOMET VERSANAIL AND OIC SCREW;  Surgeon: Myrene Galas, MD;  Location: [facility] OR;  Service: Orthopedics;  Laterality: Left;   SACRO-ILIAC PINNING Left 09/17/2016   Procedure: INSERTION LEFT SACRO-ILIAC SCREW AND EVACUATION HEMATOMA LEFT HIP;  Surgeon: Myrene Galas, MD;  Location: [facility] OR;  Service: Orthopedics;  Laterality: Left;   TIBIA IM NAIL INSERTION Left 09/17/2016   Procedure: INTRAMEDULLARY (IM) NAIL TIBIAL;  Surgeon: Myrene Galas, MD;  Location: [facility] OR;   Service: Orthopedics;  Laterality: Left;    Family History  Problem Relation Age of Onset   Healthy Mother        history of GBS   Healthy Father    Other Brother        cystic mass on brain   Scleroderma Maternal Grandmother    Prostate cancer Maternal Grandfather     Social History   Tobacco Use   Smoking status: Former   Smokeless tobacco: Former  Building services engineer Use: Never used  Substance Use Topics   Alcohol use: Yes    Comment: occasionally   Drug use: No    ROS   Objective:   Vitals: BP (!) 151/115 (BP Location: Right Arm)   Pulse (!) 112   Temp 98.7 F (37.1 C) (Oral)   Resp 16   SpO2 94%   Physical Exam Constitutional:      General: He is not in acute distress.    Appearance: Normal appearance. He is well-developed. He is not ill-appearing, toxic-appearing or diaphoretic.  HENT:     Head: Normocephalic and atraumatic.     Right Ear: External ear normal.     Left Ear: External ear normal.     Nose: Nose normal.     Mouth/Throat:     Mouth: Mucous membranes are moist.  Eyes:     General: No scleral icterus.       Right eye: No discharge.        Left eye: No discharge.  Extraocular Movements: Extraocular movements intact.  Cardiovascular:     Rate and Rhythm: Normal rate and regular rhythm.     Heart sounds: Normal heart sounds. No murmur heard.    No friction rub. No gallop.  Pulmonary:     Effort: Pulmonary effort is normal. No respiratory distress.     Breath sounds: No stridor. Examination of the left-upper field reveals rhonchi. Examination of the left-middle field reveals rhonchi. Examination of the left-lower field reveals rhonchi. Rhonchi present. No wheezing or rales.  Neurological:     Mental Status: He is alert and oriented to person, place, and time.  Psychiatric:        Mood and Affect: Mood normal.        Behavior: Behavior normal.        Thought Content: Thought content normal.    DG Chest 2 View  Result Date:  04/16/2022 CLINICAL DATA:  Shortness of breath. EXAM: CHEST - 2 VIEW COMPARISON:  September 17, 2016. FINDINGS: The heart size and mediastinal contours are within normal limits. Both lungs are clear. The visualized skeletal structures are unremarkable. IMPRESSION: No active cardiopulmonary disease. Electronically Signed   By: Marijo Conception M.D.   On: 04/16/2022 14:50     Assessment and Plan :   PDMP not reviewed this encounter.  1. Acute bronchitis, unspecified organism   2. White coat syndrome without hypertension     We will manage for acute bronchitis with oral prednisone course, supportive care.  Chest x-ray was negative for pneumonia.  Recommended he continue to monitor his vital signs at home and avoid the use of albuterol inhaler if his pulse remains above 100 bpm.  Recheck with his PCP.  Patient has a low PERC score, will defer ER visit for chest CT scan for now.  Counseled patient on potential for adverse effects with medications prescribed/recommended today, ER and return-to-clinic precautions discussed, patient verbalized understanding.    Jaynee Eagles, [place] 04/16/22 1523
